# Patient Record
Sex: Male | Born: 1945 | Race: White | Hispanic: No
Health system: Southern US, Community
[De-identification: ages and names within clinical notes are randomized; demographics above are authoritative.]

## PROBLEM LIST (undated history)

## (undated) DIAGNOSIS — I82A11 Acute embolism and thrombosis of right axillary vein: Secondary | ICD-10-CM

## (undated) DIAGNOSIS — I5022 Chronic systolic (congestive) heart failure: Secondary | ICD-10-CM

## (undated) DIAGNOSIS — Z930 Tracheostomy status: Secondary | ICD-10-CM

## (undated) DIAGNOSIS — J9621 Acute and chronic respiratory failure with hypoxia: Secondary | ICD-10-CM

## (undated) HISTORY — DX: Acute embolism and thrombosis of right axillary vein: I82.A11

## (undated) HISTORY — DX: Tracheostomy status: Z93.0

## (undated) HISTORY — DX: Chronic systolic (congestive) heart failure: I50.22

## (undated) HISTORY — DX: Acute and chronic respiratory failure with hypoxia: J96.21

---

## 2020-11-19 ENCOUNTER — Other Ambulatory Visit (HOSPITAL_COMMUNITY): Payer: Medicare Other

## 2020-11-19 ENCOUNTER — Inpatient Hospital Stay
Admission: RE | Admit: 2020-11-19 | Discharge: 2020-12-22 | Disposition: E | Payer: Medicare Other | Attending: Internal Medicine | Admitting: Internal Medicine

## 2020-11-19 DIAGNOSIS — I5022 Chronic systolic (congestive) heart failure: Secondary | ICD-10-CM | POA: Diagnosis present

## 2020-11-19 DIAGNOSIS — Z931 Gastrostomy status: Secondary | ICD-10-CM

## 2020-11-19 DIAGNOSIS — J189 Pneumonia, unspecified organism: Secondary | ICD-10-CM

## 2020-11-19 DIAGNOSIS — J9621 Acute and chronic respiratory failure with hypoxia: Secondary | ICD-10-CM | POA: Diagnosis present

## 2020-11-19 DIAGNOSIS — I82A11 Acute embolism and thrombosis of right axillary vein: Secondary | ICD-10-CM | POA: Diagnosis present

## 2020-11-19 DIAGNOSIS — Z930 Tracheostomy status: Secondary | ICD-10-CM

## 2020-11-19 DIAGNOSIS — I509 Heart failure, unspecified: Secondary | ICD-10-CM

## 2020-11-19 DIAGNOSIS — J9690 Respiratory failure, unspecified, unspecified whether with hypoxia or hypercapnia: Secondary | ICD-10-CM

## 2020-11-19 LAB — URINALYSIS, ROUTINE W REFLEX MICROSCOPIC
Bilirubin Urine: NEGATIVE
Glucose, UA: NEGATIVE mg/dL
Hgb urine dipstick: NEGATIVE
Ketones, ur: NEGATIVE mg/dL
Nitrite: NEGATIVE
Protein, ur: 30 mg/dL — AB
Specific Gravity, Urine: 1.012 (ref 1.005–1.030)
WBC, UA: >50 WBC/hpf — ABNORMAL HIGH (ref 0–5)
pH: 8 (ref 5.0–8.0)

## 2020-11-19 MED ORDER — IOHEXOL 180 MG/ML  SOLN
50.0000 mL | Freq: Once | INTRAMUSCULAR | Status: AC | PRN
  Administered 2020-11-19: 50 mL

## 2020-11-20 DIAGNOSIS — I5022 Chronic systolic (congestive) heart failure: Secondary | ICD-10-CM

## 2020-11-20 DIAGNOSIS — Z930 Tracheostomy status: Secondary | ICD-10-CM | POA: Diagnosis not present

## 2020-11-20 DIAGNOSIS — I82A11 Acute embolism and thrombosis of right axillary vein: Secondary | ICD-10-CM | POA: Diagnosis not present

## 2020-11-20 DIAGNOSIS — J9621 Acute and chronic respiratory failure with hypoxia: Secondary | ICD-10-CM

## 2020-11-20 LAB — COMPREHENSIVE METABOLIC PANEL
ALT: 25 U/L (ref 0–44)
AST: 35 U/L (ref 15–41)
Albumin: 1.7 g/dL — ABNORMAL LOW (ref 3.5–5.0)
Alkaline Phosphatase: 62 U/L (ref 38–126)
Anion gap: 10 (ref 5–15)
BUN: 26 mg/dL — ABNORMAL HIGH (ref 8–23)
CO2: 29 mmol/L (ref 22–32)
Calcium: 8.4 mg/dL — ABNORMAL LOW (ref 8.9–10.3)
Chloride: 99 mmol/L (ref 98–111)
Creatinine, Ser: 0.67 mg/dL (ref 0.61–1.24)
GFR, Estimated: >60 mL/min (ref >60)
Glucose, Bld: 122 mg/dL — ABNORMAL HIGH (ref 70–99)
Potassium: 4.2 mmol/L (ref 3.5–5.1)
Sodium: 138 mmol/L (ref 135–145)
Total Bilirubin: 0.3 mg/dL (ref 0.3–1.2)
Total Protein: 5.9 g/dL — ABNORMAL LOW (ref 6.5–8.1)

## 2020-11-20 LAB — CBC
HCT: 30 % — ABNORMAL LOW (ref 39.0–52.0)
Hemoglobin: 9.5 g/dL — ABNORMAL LOW (ref 13.0–17.0)
MCH: 32.8 pg (ref 26.0–34.0)
MCHC: 31.7 g/dL (ref 30.0–36.0)
MCV: 103.4 fL — ABNORMAL HIGH (ref 80.0–100.0)
Platelets: 155 10*3/uL (ref 150–400)
RBC: 2.9 MIL/uL — ABNORMAL LOW (ref 4.22–5.81)
RDW: 16 % — ABNORMAL HIGH (ref 11.5–15.5)
WBC: 11.1 10*3/uL — ABNORMAL HIGH (ref 4.0–10.5)
nRBC: 0 % (ref 0.0–0.2)

## 2020-11-20 LAB — TSH: TSH: 9.79 u[IU]/mL — ABNORMAL HIGH (ref 0.350–4.500)

## 2020-11-20 NOTE — Consult Note (Signed)
Pulmonary Critical Care Medicine [facility] [place]  PULMONARY SERVICE  Date of Service: 11/20/2020  PULMONARY CRITICAL CARE CONSULT   Jaime Fowler  NUU:[id number]  DOB: 1946-07-20   DOA: Dec 15, 2020  Referring Physician: Carron Curie, MD  HPI: Jaime Fowler is a 74 y.o. male seen for follow up of Acute on Chronic Respiratory Failure. 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 Patient apparently has been on life vest because of the severe cardiomyopathy. He stopped wearing it for about a month because of discomfort and was evaluated for AICD had declined at this time. Patient went to the emergency department because of increasing shortness of breath and leg swelling. At that time patient was found to be in severe pulmonary edema and was actually intubated placed on mechanical ventilation. Subsequently had difficulty weaning off the ventilator and had to have a tracheostomy done. Patient did get a left and right heart catheterization started on anticoagulation was found to have a DVT in the upper extremity. Patient is on T collar currently on 35% FiO2 with good saturations.  Secretions have been minimal  Review of Systems:  ROS performed and is unremarkable other than noted above.  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 General: Comfortable at this time . Eyes: Grossly normal lids, irises & conjunctiva . ENT: grossly tongue is normal . Neck: no obvious mass . Cardiovascular: S1-S2 normal no gallop or rub . Respiratory: No rhonchi coarse breath sounds . Abdomen: Soft and nontender . Skin: no rash seen on limited exam . Musculoskeletal: not rigid . Psychiatric:unable to assess . Neurologic: no seizure no involuntary movements         Labs on Admission:  Basic Metabolic Panel: Recent Labs  Lab 11/20/20 0512  NA 138  K 4.2  CL 99  CO2 29  GLUCOSE 122*  BUN 26*  CREATININE 0.67  CALCIUM 8.4*    No results for input(s): PHART, PCO2ART, PO2ART, HCO3, O2SAT in the last 168 hours.  Liver Function Tests: Recent Labs  Lab 11/20/20 0512  AST 35  ALT 25  ALKPHOS 62  BILITOT 0.3  PROT 5.9*  ALBUMIN 1.7*   No results for input(s): LIPASE, AMYLASE in the last 168 hours. No results for input(s): AMMONIA in the last 168 hours.  CBC: Recent Labs  Lab 11/20/20 0512  WBC 11.1*  HGB 9.5*  HCT 30.0*  MCV 103.4*  PLT 155    Cardiac Enzymes: No results for input(s): CKTOTAL, CKMB, CKMBINDEX, TROPONINI in the last 168 hours.  BNP (last 3 results) No results for input(s): BNP in the last 8760 hours.  ProBNP (last 3 results) No results for input(s): PROBNP in the last 8760 hours.   Radiological Exams on Admission: DG Chest 1 View  Result Date: 12-15-20 CLINICAL DATA:  Respiratory failure EXAM: CHEST  1 VIEW COMPARISON:  None. FINDINGS: Tracheostomy tube tip is just below the level of the  clavicular heads. No focal airspace consolidation. Cardiomediastinal contours are normal. No pleural effusion or pneumothorax. IMPRESSION: Clear lungs. Electronically Signed   By: Deatra Robinson M.D.   On: 12/13/2020 22:17   DG ABDOMEN PEG TUBE LOCATION  Result Date: Dec 10, 2020 CLINICAL DATA:  Gastrostomy tube placement EXAM: ABDOMEN - 1 VIEW COMPARISON:   None. FINDINGS: Contrast injected through the gastrostomy tube outlines rugal folds of the stomach. There is transit seen into the duodenum and proximal jejunum. IMPRESSION: Gastrostomy tube tip in the stomach. Electronically Signed   By: Deatra Robinson M.D.   On: 12/16/2020 22:23    Assessment/Plan Active Problems:   Acute on chronic respiratory failure with hypoxia (HCC)   Chronic HFrEF (heart failure with reduced ejection fraction) (HCC)   Tracheostomy status (HCC)   Acute deep vein thrombosis (DVT) of axillary vein of right upper extremity (HCC)   1. Acute on chronic respiratory failure hypoxia patient is doing fairly well on 35% FiO2 good saturations are noted titrate oxygen continue with aggressive pulmonary toilet. We should be able to titrate the oxygen down and wean further hopefully begin with the PMV and capping prognosis is guarded because of the low ejection fraction. 2. Chronic heart failure reduced ejection fraction the patient's last known EF is 10 to 15%. Patient is on full conservative therapy overall prognosis quite poor 3. Tracheostomy status patient right now is doing fine with the tracheostomy in place we will continue to try to advance weaning as tolerated. 4. Right upper extremity DVT patient has been on Xarelto 20 mg continue  I have personally seen and evaluated the patient, evaluated laboratory and imaging results, formulated the assessment and plan and placed orders. The Patient requires high complexity decision making with multiple systems involvement.  Case was discussed on Rounds with the Respiratory Therapy Director and the Respiratory staff Time Spent  Yevonne Pax, MD [facility] Pulmonary Critical Care Medicine Sleep Medicine

## 2020-11-21 DIAGNOSIS — I82A11 Acute embolism and thrombosis of right axillary vein: Secondary | ICD-10-CM | POA: Diagnosis not present

## 2020-11-21 DIAGNOSIS — I5022 Chronic systolic (congestive) heart failure: Secondary | ICD-10-CM | POA: Diagnosis not present

## 2020-11-21 DIAGNOSIS — Z930 Tracheostomy status: Secondary | ICD-10-CM | POA: Diagnosis not present

## 2020-11-21 DIAGNOSIS — J9621 Acute and chronic respiratory failure with hypoxia: Secondary | ICD-10-CM | POA: Diagnosis not present

## 2020-11-21 NOTE — Progress Notes (Signed)
Pulmonary Critical Care Medicine [facility] [place]   PULMONARY CRITICAL CARE SERVICE  PROGRESS NOTE  Date of Service: 11/21/2020  Jaime Fowler  [id number]  DOB: Jan 02, 1946   DOA: 11/23/2020  Referring Physician: Carron Curie, MD  HPI: Jaime Fowler is a 74 y.o. male seen for follow up of Acute on Chronic Respiratory Failure.  Patient is on T collar has been on 35% FiO2 good saturations are noted.  Secretions are moderate  Medications: Reviewed on Rounds  Physical Exam:  Vitals: Temperature is 97.0 pulse 81 respiratory rate 20 blood pressure 100/51 saturations 99%  Ventilator Settings on T collar FiO2 35%  . General: Comfortable at this time . Eyes: Grossly normal lids, irises & conjunctiva . ENT: grossly tongue is normal . Neck: no obvious mass . Cardiovascular: S1 S2 normal no gallop . Respiratory: Scattered rhonchi expansion is equal . Abdomen: soft . Skin: no rash seen on limited exam . Musculoskeletal: not rigid . Psychiatric:unable to assess . Neurologic: no seizure no involuntary movements         Lab Data:   Basic Metabolic Panel: Recent Labs  Lab 11/20/20 0512  NA 138  K 4.2  CL 99  CO2 29  GLUCOSE 122*  BUN 26*  CREATININE 0.67  CALCIUM 8.4*    ABG: No results for input(s): PHART, PCO2ART, PO2ART, HCO3, O2SAT in the last 168 hours.  Liver Function Tests: Recent Labs  Lab 11/20/20 0512  AST 35  ALT 25  ALKPHOS 62  BILITOT 0.3  PROT 5.9*  ALBUMIN 1.7*   No results for input(s): LIPASE, AMYLASE in the last 168 hours. No results for input(s): AMMONIA in the last 168 hours.  CBC: Recent Labs  Lab 11/20/20 0512  WBC 11.1*  HGB 9.5*  HCT 30.0*  MCV 103.4*  PLT 155    Cardiac Enzymes: No results for input(s): CKTOTAL, CKMB, CKMBINDEX, TROPONINI in the last 168 hours.  BNP (last 3 results) No results for input(s): BNP in the last 8760 hours.  ProBNP (last 3 results) No results for input(s): PROBNP in the last 8760  hours.  Radiological Exams: DG Chest 1 View  Result Date: 11/29/2020 CLINICAL DATA:  Respiratory failure EXAM: CHEST  1 VIEW COMPARISON:  None. FINDINGS: Tracheostomy tube tip is just below the level of the clavicular heads. No focal airspace consolidation. Cardiomediastinal contours are normal. No pleural effusion or pneumothorax. IMPRESSION: Clear lungs. Electronically Signed   By: Deatra Robinson M.D.   On: 10-Dec-2020 22:17   DG ABDOMEN PEG TUBE LOCATION  Result Date: Dec 10, 2020 CLINICAL DATA:  Gastrostomy tube placement EXAM: ABDOMEN - 1 VIEW COMPARISON:  None. FINDINGS: Contrast injected through the gastrostomy tube outlines rugal folds of the stomach. There is transit seen into the duodenum and proximal jejunum. IMPRESSION: Gastrostomy tube tip in the stomach. Electronically Signed   By: Deatra Robinson M.D.   On: 10-Dec-2020 22:23    Assessment/Plan Active Problems:   Acute on chronic respiratory failure with hypoxia (HCC)   Chronic HFrEF (heart failure with reduced ejection fraction) (HCC)   Tracheostomy status (HCC)   Acute deep vein thrombosis (DVT) of axillary vein of right upper extremity (HCC)   1. Acute on chronic respiratory failure hypoxia patient currently resolved T collar will titrate as ordered continue secretion management supportive care. 2. Chronic heart failure reduced ejection fraction poor EF noted on last echo continue with conservative management Entresto anticoagulation beta-blockers 3. Tracheostomy status remains in place we will continue to monitor  closely. 4. Acute DVT treated with anticoagulation has been on Xarelto   I have personally seen and evaluated the patient, evaluated laboratory and imaging results, formulated the assessment and plan and placed orders. The Patient requires high complexity decision making with multiple systems involvement.  Rounds were done with the Respiratory Therapy Director and Staff therapists and discussed with nursing staff  also.  Yevonne Pax, MD [facility] Pulmonary Critical Care Medicine Sleep Medicine

## 2020-11-21 DEATH — deceased

## 2020-11-22 DIAGNOSIS — J9621 Acute and chronic respiratory failure with hypoxia: Secondary | ICD-10-CM | POA: Diagnosis not present

## 2020-11-22 DIAGNOSIS — I82A11 Acute embolism and thrombosis of right axillary vein: Secondary | ICD-10-CM | POA: Diagnosis not present

## 2020-11-22 DIAGNOSIS — Z930 Tracheostomy status: Secondary | ICD-10-CM | POA: Diagnosis not present

## 2020-11-22 DIAGNOSIS — I5022 Chronic systolic (congestive) heart failure: Secondary | ICD-10-CM | POA: Diagnosis not present

## 2020-11-22 NOTE — Progress Notes (Signed)
Pulmonary Critical Care Medicine [facility] [place]   PULMONARY CRITICAL CARE SERVICE  PROGRESS NOTE  Date of Service: 11/22/2020  Jaime Fowler  TML:[id number]  DOB: [birth year]   DOA: 12-11-20  Referring Physician: Carron Curie, MD  HPI: Jaime Fowler is a 74 y.o. male seen for follow up of Acute on Chronic Respiratory Failure.  Patient currently is on 35% FiO2 on T-piece good saturations are noted  Medications: Reviewed on Rounds  Physical Exam:  Vitals: Temperature is 97.4 pulse 72 respiratory 35 blood pressure is 121/77 saturations 98%  Ventilator Settings off ventilator on T collar  . General: Comfortable at this time . Eyes: Grossly normal lids, irises & conjunctiva . ENT: grossly tongue is normal . Neck: no obvious mass . Cardiovascular: S1 S2 normal no gallop . Respiratory: Scattered rhonchi expansion is equal . Abdomen: soft . Skin: no rash seen on limited exam . Musculoskeletal: not rigid . Psychiatric:unable to assess . Neurologic: no seizure no involuntary movements         Lab Data:   Basic Metabolic Panel: Recent Labs  Lab 11/20/20 0512  NA 138  K 4.2  CL 99  CO2 29  GLUCOSE 122*  BUN 26*  CREATININE 0.67  CALCIUM 8.4*    ABG: No results for input(s): PHART, PCO2ART, PO2ART, HCO3, O2SAT in the last 168 hours.  Liver Function Tests: Recent Labs  Lab 11/20/20 0512  AST 35  ALT 25  ALKPHOS 62  BILITOT 0.3  PROT 5.9*  ALBUMIN 1.7*   No results for input(s): LIPASE, AMYLASE in the last 168 hours. No results for input(s): AMMONIA in the last 168 hours.  CBC: Recent Labs  Lab 11/20/20 0512  WBC 11.1*  HGB 9.5*  HCT 30.0*  MCV 103.4*  PLT 155    Cardiac Enzymes: No results for input(s): CKTOTAL, CKMB, CKMBINDEX, TROPONINI in the last 168 hours.  BNP (last 3 results) No results for input(s): BNP in the last 8760 hours.  ProBNP (last 3 results) No results for input(s): PROBNP in the last 8760  hours.  Radiological Exams: No results found.  Assessment/Plan Active Problems:   Acute on chronic respiratory failure with hypoxia (HCC)   Chronic HFrEF (heart failure with reduced ejection fraction) (HCC)   Tracheostomy status (HCC)   Acute deep vein thrombosis (DVT) of axillary vein of right upper extremity (HCC)   1. Acute on chronic respiratory failure with hypoxia we will continue T-piece titrate oxygen as tolerated remains on 35% FiO2. 2. Tracheostomy status tracheostomy remains in place 3. Chronic heart failure reduced ejection fraction at baseline we will continue to monitor 4. Acute DVT treated we will continue with supportive care   I have personally seen and evaluated the patient, evaluated laboratory and imaging results, formulated the assessment and plan and placed orders. The Patient requires high complexity decision making with multiple systems involvement.  Rounds were done with the Respiratory Therapy Director and Staff therapists and discussed with nursing staff also.  Yevonne Pax, MD [facility] Pulmonary Critical Care Medicine Sleep Medicine

## 2020-11-23 ENCOUNTER — Encounter: Payer: Self-pay | Admitting: Internal Medicine

## 2020-11-23 DIAGNOSIS — J9621 Acute and chronic respiratory failure with hypoxia: Secondary | ICD-10-CM | POA: Diagnosis not present

## 2020-11-23 DIAGNOSIS — Z930 Tracheostomy status: Secondary | ICD-10-CM | POA: Diagnosis not present

## 2020-11-23 DIAGNOSIS — I5022 Chronic systolic (congestive) heart failure: Secondary | ICD-10-CM | POA: Diagnosis not present

## 2020-11-23 DIAGNOSIS — I82A11 Acute embolism and thrombosis of right axillary vein: Secondary | ICD-10-CM | POA: Diagnosis present

## 2020-11-23 NOTE — Progress Notes (Signed)
Pulmonary Critical Care Medicine [facility] [place]   PULMONARY CRITICAL CARE SERVICE  PROGRESS NOTE  Date of Service: 11/23/2020  Jaime Fowler  [id number]  DOB: Dec 23, 1945   DOA: 11/14/2020  Referring Physician: Carron Curie, MD  HPI: Jaime Fowler is a 74 y.o. male seen for follow up of Acute on Chronic Respiratory Failure. Patient currently is on T collar has been on 20% FiO2 with good saturations.  Medications: Reviewed on Rounds  Physical Exam:  Vitals: Temperature is 96.9 pulse 73 respiratory 27 blood pressure 105/58 saturations 98%  Ventilator Settings on T collar with an FiO2 of 28%  . General: Comfortable at this time . Eyes: Grossly normal lids, irises & conjunctiva . ENT: grossly tongue is normal . Neck: no obvious mass . Cardiovascular: S1 S2 normal no gallop . Respiratory: No rhonchi no rales noted at this time . Abdomen: soft . Skin: no rash seen on limited exam . Musculoskeletal: not rigid . Psychiatric:unable to assess . Neurologic: no seizure no involuntary movements         Lab Data:   Basic Metabolic Panel: Recent Labs  Lab 11/20/20 0512  NA 138  K 4.2  CL 99  CO2 29  GLUCOSE 122*  BUN 26*  CREATININE 0.67  CALCIUM 8.4*    ABG: No results for input(s): PHART, PCO2ART, PO2ART, HCO3, O2SAT in the last 168 hours.  Liver Function Tests: Recent Labs  Lab 11/20/20 0512  AST 35  ALT 25  ALKPHOS 62  BILITOT 0.3  PROT 5.9*  ALBUMIN 1.7*   No results for input(s): LIPASE, AMYLASE in the last 168 hours. No results for input(s): AMMONIA in the last 168 hours.  CBC: Recent Labs  Lab 11/20/20 0512  WBC 11.1*  HGB 9.5*  HCT 30.0*  MCV 103.4*  PLT 155    Cardiac Enzymes: No results for input(s): CKTOTAL, CKMB, CKMBINDEX, TROPONINI in the last 168 hours.  BNP (last 3 results) No results for input(s): BNP in the last 8760 hours.  ProBNP (last 3 results) No results for input(s): PROBNP in the last 8760  hours.  Radiological Exams: No results found.  Assessment/Plan Active Problems:   Acute on chronic respiratory failure with hypoxia (HCC)   Chronic HFrEF (heart failure with reduced ejection fraction) (HCC)   Tracheostomy status (HCC)   Acute deep vein thrombosis (DVT) of axillary vein of right upper extremity (HCC)   1. Acute on chronic respiratory failure hypoxia we will continue with T-piece oxygen is down to 28%. Continue to titrate. 2. Tracheostomy still remains in place try PMV 3. Chronic heart failure reduced ejection fraction continue supportive care prognosis poor 4. Acute DVT has been on anticoagulation   I have personally seen and evaluated the patient, evaluated laboratory and imaging results, formulated the assessment and plan and placed orders. The Patient requires high complexity decision making with multiple systems involvement.  Rounds were done with the Respiratory Therapy Director and Staff therapists and discussed with nursing staff also.  Yevonne Pax, MD [facility] Pulmonary Critical Care Medicine Sleep Medicine

## 2020-11-24 ENCOUNTER — Other Ambulatory Visit (HOSPITAL_COMMUNITY): Payer: Medicare Other

## 2020-11-24 DIAGNOSIS — I82A11 Acute embolism and thrombosis of right axillary vein: Secondary | ICD-10-CM | POA: Diagnosis not present

## 2020-11-24 DIAGNOSIS — J9621 Acute and chronic respiratory failure with hypoxia: Secondary | ICD-10-CM | POA: Diagnosis not present

## 2020-11-24 DIAGNOSIS — I5022 Chronic systolic (congestive) heart failure: Secondary | ICD-10-CM | POA: Diagnosis not present

## 2020-11-24 DIAGNOSIS — Z930 Tracheostomy status: Secondary | ICD-10-CM | POA: Diagnosis not present

## 2020-11-24 LAB — CBC
HCT: 27.5 % — ABNORMAL LOW (ref 39.0–52.0)
Hemoglobin: 8.8 g/dL — ABNORMAL LOW (ref 13.0–17.0)
MCH: 33.2 pg (ref 26.0–34.0)
MCHC: 32 g/dL (ref 30.0–36.0)
MCV: 103.8 fL — ABNORMAL HIGH (ref 80.0–100.0)
Platelets: 188 10*3/uL (ref 150–400)
RBC: 2.65 MIL/uL — ABNORMAL LOW (ref 4.22–5.81)
RDW: 16.1 % — ABNORMAL HIGH (ref 11.5–15.5)
WBC: 12.2 10*3/uL — ABNORMAL HIGH (ref 4.0–10.5)
nRBC: 0 % (ref 0.0–0.2)

## 2020-11-24 LAB — BASIC METABOLIC PANEL
Anion gap: 10 (ref 5–15)
BUN: 49 mg/dL — ABNORMAL HIGH (ref 8–23)
CO2: 30 mmol/L (ref 22–32)
Calcium: 8.8 mg/dL — ABNORMAL LOW (ref 8.9–10.3)
Chloride: 102 mmol/L (ref 98–111)
Creatinine, Ser: 0.6 mg/dL — ABNORMAL LOW (ref 0.61–1.24)
GFR, Estimated: >60 mL/min (ref >60)
Glucose, Bld: 100 mg/dL — ABNORMAL HIGH (ref 70–99)
Potassium: 4 mmol/L (ref 3.5–5.1)
Sodium: 142 mmol/L (ref 135–145)

## 2020-11-24 NOTE — Progress Notes (Addendum)
Pulmonary Critical Care Medicine [facility] [place]   PULMONARY CRITICAL CARE SERVICE  PROGRESS NOTE  Date of Service: 11/24/2020  [name]  ZOX:[id number]  DOB: 1946-07-03   DOA: 11/18/2020  Referring Physician: Carron Curie, MD  HPI: Jaime Fowler is a 74 y.o. male seen for follow up of Acute on Chronic Respiratory Failure.  Patient currently is on T collar has been on 20% FiO2 with good saturations.  Medications: Reviewed on Rounds  Physical Exam:  Vitals: Temperature is 97.6 pulse 71 respiratory rate is 35 blood pressure is 96/51 saturations 99%  Ventilator Settings on T collar FiO2 28%  . General: Comfortable at this time . Eyes: Grossly normal lids, irises & conjunctiva . ENT: grossly tongue is normal . Neck: no obvious mass . Cardiovascular: S1 S2 normal no gallop . Respiratory: No rhonchi very coarse breath sounds . Abdomen: soft . Skin: no rash seen on limited exam . Musculoskeletal: not rigid . Psychiatric:unable to assess . Neurologic: no seizure no involuntary movements         Lab Data:   Basic Metabolic Panel: Recent Labs  Lab 11/20/20 0512 11/24/20 0359  NA 138 142  K 4.2 4.0  CL 99 102  CO2 29 30  GLUCOSE 122* 100*  BUN 26* 49*  CREATININE 0.67 0.60*  CALCIUM 8.4* 8.8*    ABG: No results for input(s): PHART, PCO2ART, PO2ART, HCO3, O2SAT in the last 168 hours.  Liver Function Tests: Recent Labs  Lab 11/20/20 0512  AST 35  ALT 25  ALKPHOS 62  BILITOT 0.3  PROT 5.9*  ALBUMIN 1.7*   No results for input(s): LIPASE, AMYLASE in the last 168 hours. No results for input(s): AMMONIA in the last 168 hours.  CBC: Recent Labs  Lab 11/20/20 0512 11/24/20 0359  WBC 11.1* 12.2*  HGB 9.5* 8.8*  HCT 30.0* 27.5*  MCV 103.4* 103.8*  PLT 155 188    Cardiac Enzymes: No results for input(s): CKTOTAL, CKMB, CKMBINDEX, TROPONINI in the last 168 hours.  BNP (last 3 results) No results for input(s): BNP in the last 8760  hours.  ProBNP (last 3 results) No results for input(s): PROBNP in the last 8760 hours.  Radiological Exams: DG CHEST PORT 1 VIEW  Result Date: 11/24/2020 CLINICAL DATA:  Respiratory failure EXAM: PORTABLE CHEST 1 VIEW COMPARISON:  Chest x-ray dated 11/18/2020 FINDINGS: Tracheostomy tube appears appropriately positioned in the midline. Heart size and mediastinal contours are stable. Subtle perihilar and bibasilar airspace opacities concerning for early developing pneumonia. No pleural effusion or pneumothorax is seen. IMPRESSION: Subtle bilateral perihilar and basilar airspace opacities, pneumonia versus mild pulmonary edema. Electronically Signed   By: Bary Richard M.D.   On: 11/24/2020 08:16    Assessment/Plan Active Problems:   Acute on chronic respiratory failure with hypoxia (HCC)   Chronic HFrEF (heart failure with reduced ejection fraction) (HCC)   Tracheostomy status (HCC)   Acute deep vein thrombosis (DVT) of axillary vein of right upper extremity (HCC)   1. Acute on chronic respiratory failure hypoxia we will continue T collar trials titrate oxygen continue pulmonary toilet 2. Chronic heart failure reduced ejection appears to be compensated continue to follow 3. Tracheostomy remains in place we will continue to follow along 4. Acute DVT treated   I have personally seen and evaluated the patient, evaluated laboratory and imaging results, formulated the assessment and plan and placed orders. The Patient requires high complexity decision making with multiple systems involvement.  Rounds were  done with the Respiratory Therapy Director and Staff therapists and discussed with nursing staff also.  Allyne Gee, MD [facility] Pulmonary Critical Care Medicine Sleep Medicine

## 2020-11-25 DIAGNOSIS — Z930 Tracheostomy status: Secondary | ICD-10-CM | POA: Diagnosis not present

## 2020-11-25 DIAGNOSIS — I5022 Chronic systolic (congestive) heart failure: Secondary | ICD-10-CM | POA: Diagnosis not present

## 2020-11-25 DIAGNOSIS — J9621 Acute and chronic respiratory failure with hypoxia: Secondary | ICD-10-CM | POA: Diagnosis not present

## 2020-11-25 DIAGNOSIS — I82A11 Acute embolism and thrombosis of right axillary vein: Secondary | ICD-10-CM | POA: Diagnosis not present

## 2020-11-25 NOTE — Progress Notes (Signed)
Pulmonary Critical Care Medicine [facility] [place]   PULMONARY CRITICAL CARE SERVICE  PROGRESS NOTE  Date of Service: 11/25/2020  Jaime Fowler  [id number]  DOB: Jun 09, 1976   DOA: 11/24/2020  Referring Physician: Carron Curie, MD  HPI: Jaime Fowler is a 74 y.o. male seen for follow up of Acute on Chronic Respiratory Failure.  Patient is currently on T collar has been on 20% FiO2 he has copious secretions noted  Medications: Reviewed on Rounds  Physical Exam:  Vitals: Temperature is 97.2 pulse 78 respiratory rate 29 blood pressure is 153/65 saturations 96%  Ventilator Settings on T collar with an FiO2 28%  . General: Comfortable at this time . Eyes: Grossly normal lids, irises & conjunctiva . ENT: grossly tongue is normal . Neck: no obvious mass . Cardiovascular: S1 S2 normal no gallop . Respiratory: Scattered rhonchi very coarse breath sound . Abdomen: soft . Skin: no rash seen on limited exam . Musculoskeletal: not rigid . Psychiatric:unable to assess . Neurologic: no seizure no involuntary movements         Lab Data:   Basic Metabolic Panel: Recent Labs  Lab 11/20/20 0512 11/24/20 0359  NA 138 142  K 4.2 4.0  CL 99 102  CO2 29 30  GLUCOSE 122* 100*  BUN 26* 49*  CREATININE 0.67 0.60*  CALCIUM 8.4* 8.8*    ABG: No results for input(s): PHART, PCO2ART, PO2ART, HCO3, O2SAT in the last 168 hours.  Liver Function Tests: Recent Labs  Lab 11/20/20 0512  AST 35  ALT 25  ALKPHOS 62  BILITOT 0.3  PROT 5.9*  ALBUMIN 1.7*   No results for input(s): LIPASE, AMYLASE in the last 168 hours. No results for input(s): AMMONIA in the last 168 hours.  CBC: Recent Labs  Lab 11/20/20 0512 11/24/20 0359  WBC 11.1* 12.2*  HGB 9.5* 8.8*  HCT 30.0* 27.5*  MCV 103.4* 103.8*  PLT 155 188    Cardiac Enzymes: No results for input(s): CKTOTAL, CKMB, CKMBINDEX, TROPONINI in the last 168 hours.  BNP (last 3 results) No results for input(s): BNP  in the last 8760 hours.  ProBNP (last 3 results) No results for input(s): PROBNP in the last 8760 hours.  Radiological Exams: DG CHEST PORT 1 VIEW  Result Date: 11/24/2020 CLINICAL DATA:  Respiratory failure EXAM: PORTABLE CHEST 1 VIEW COMPARISON:  Chest x-ray dated 2020-11-24 FINDINGS: Tracheostomy tube appears appropriately positioned in the midline. Heart size and mediastinal contours are stable. Subtle perihilar and bibasilar airspace opacities concerning for early developing pneumonia. No pleural effusion or pneumothorax is seen. IMPRESSION: Subtle bilateral perihilar and basilar airspace opacities, pneumonia versus mild pulmonary edema. Electronically Signed   By: Bary Richard M.D.   On: 11/24/2020 08:16    Assessment/Plan Active Problems:   Acute on chronic respiratory failure with hypoxia (HCC)   Chronic HFrEF (heart failure with reduced ejection fraction) (HCC)   Tracheostomy status (HCC)   Acute deep vein thrombosis (DVT) of axillary vein of right upper extremity (HCC)   1. Acute on chronic respiratory failure hypoxia we will continue T collar trials were now on 28% FiO2 secretions are still significant 2. Chronic heart failure reduced ejection fraction supportive care 3. Tracheostomy remains in place 4. DVT treated improving   I have personally seen and evaluated the patient, evaluated laboratory and imaging results, formulated the assessment and plan and placed orders. The Patient requires high complexity decision making with multiple systems involvement.  Rounds were done with the  Respiratory Therapy Director and Staff therapists and discussed with nursing staff also.  Allyne Gee, MD [facility] Pulmonary Critical Care Medicine Sleep Medicine

## 2020-11-26 DIAGNOSIS — I82A11 Acute embolism and thrombosis of right axillary vein: Secondary | ICD-10-CM | POA: Diagnosis not present

## 2020-11-26 DIAGNOSIS — Z930 Tracheostomy status: Secondary | ICD-10-CM | POA: Diagnosis not present

## 2020-11-26 DIAGNOSIS — I5022 Chronic systolic (congestive) heart failure: Secondary | ICD-10-CM | POA: Diagnosis not present

## 2020-11-26 DIAGNOSIS — J9621 Acute and chronic respiratory failure with hypoxia: Secondary | ICD-10-CM | POA: Diagnosis not present

## 2020-11-26 NOTE — Progress Notes (Signed)
Pulmonary Critical Care Medicine [facility] [place]   PULMONARY CRITICAL CARE SERVICE  PROGRESS NOTE  Date of Service: 11/26/2020  Jaime Fowler  DQQ:[id number]  DOB: 20-Dec-1946   DOA: 10/22/2020  Referring Physician: Carron Curie, MD  HPI: Jaime Fowler is a 73 y.o. male seen for follow up of Acute on Chronic Respiratory Failure.  Patient currently is on T collar has been on 28% FiO2 good saturations are noted copious secretions however  Medications: Reviewed on Rounds  Physical Exam:  Vitals: Temperature is 96.8 pulse 103 respiratory 21 blood pressure is 125/68 saturations 98%  Ventilator Settings on T-piece 28% FiO2  . General: Comfortable at this time . Eyes: Grossly normal lids, irises & conjunctiva . ENT: grossly tongue is normal . Neck: no obvious mass . Cardiovascular: S1 S2 normal no gallop . Respiratory: No rhonchi no rales . Abdomen: soft . Skin: no rash seen on limited exam . Musculoskeletal: not rigid . Psychiatric:unable to assess . Neurologic: no seizure no involuntary movements         Lab Data:   Basic Metabolic Panel: Recent Labs  Lab 11/20/20 0512 11/24/20 0359  NA 138 142  K 4.2 4.0  CL 99 102  CO2 29 30  GLUCOSE 122* 100*  BUN 26* 49*  CREATININE 0.67 0.60*  CALCIUM 8.4* 8.8*    ABG: No results for input(s): PHART, PCO2ART, PO2ART, HCO3, O2SAT in the last 168 hours.  Liver Function Tests: Recent Labs  Lab 11/20/20 0512  AST 35  ALT 25  ALKPHOS 62  BILITOT 0.3  PROT 5.9*  ALBUMIN 1.7*   No results for input(s): LIPASE, AMYLASE in the last 168 hours. No results for input(s): AMMONIA in the last 168 hours.  CBC: Recent Labs  Lab 11/20/20 0512 11/24/20 0359  WBC 11.1* 12.2*  HGB 9.5* 8.8*  HCT 30.0* 27.5*  MCV 103.4* 103.8*  PLT 155 188    Cardiac Enzymes: No results for input(s): CKTOTAL, CKMB, CKMBINDEX, TROPONINI in the last 168 hours.  BNP (last 3 results) No results for input(s): BNP in the last  8760 hours.  ProBNP (last 3 results) No results for input(s): PROBNP in the last 8760 hours.  Radiological Exams: No results found.  Assessment/Plan Active Problems:   Acute on chronic respiratory failure with hypoxia (HCC)   Chronic HFrEF (heart failure with reduced ejection fraction) (HCC)   Tracheostomy status (HCC)   Acute deep vein thrombosis (DVT) of axillary vein of right upper extremity (HCC)   1. Acute on chronic respiratory failure hypoxia we will continue with T-piece titrate oxygen continue pulmonary toilet. 2. Chronic heart failure reduced ejection fraction monitoring fluid status 3. Tracheostomy remains in place 4. Acute DVT treated continue with supportive care   I have personally seen and evaluated the patient, evaluated laboratory and imaging results, formulated the assessment and plan and placed orders. The Patient requires high complexity decision making with multiple systems involvement.  Rounds were done with the Respiratory Therapy Director and Staff therapists and discussed with nursing staff also.  Yevonne Pax, MD [facility] Pulmonary Critical Care Medicine Sleep Medicine

## 2020-11-28 DIAGNOSIS — I5022 Chronic systolic (congestive) heart failure: Secondary | ICD-10-CM | POA: Diagnosis not present

## 2020-11-28 DIAGNOSIS — J9621 Acute and chronic respiratory failure with hypoxia: Secondary | ICD-10-CM | POA: Diagnosis not present

## 2020-11-28 DIAGNOSIS — Z930 Tracheostomy status: Secondary | ICD-10-CM | POA: Diagnosis not present

## 2020-11-28 DIAGNOSIS — I82A11 Acute embolism and thrombosis of right axillary vein: Secondary | ICD-10-CM | POA: Diagnosis not present

## 2020-11-28 LAB — URINALYSIS, ROUTINE W REFLEX MICROSCOPIC
Bilirubin Urine: NEGATIVE
Glucose, UA: NEGATIVE mg/dL
Hgb urine dipstick: NEGATIVE
Ketones, ur: NEGATIVE mg/dL
Nitrite: NEGATIVE
Protein, ur: 30 mg/dL — AB
Specific Gravity, Urine: 1.016 (ref 1.005–1.030)
WBC, UA: >50 WBC/hpf — ABNORMAL HIGH (ref 0–5)
pH: 8 (ref 5.0–8.0)

## 2020-11-28 NOTE — Progress Notes (Signed)
Pulmonary Critical Care Medicine [facility] [place]   PULMONARY CRITICAL CARE SERVICE  PROGRESS NOTE  Date of Service: 11/28/2020  Jaime Fowler  [id number]:[id number]  DOB: Jul 28, 1946   DOA: 11/01/2020  Referring Physician: Carron Curie, MD  HPI: Jaime Fowler is a 74 y.o. male seen for follow up of Acute on Chronic Respiratory Failure.  Currently is on T collar has been on 28% FiO2 good saturations.  Medications: Reviewed on Rounds  Physical Exam:  Vitals: Temperature is 96.1 pulse 78 respiratory 27 blood pressure is 117/72 saturations 100%  Ventilator Settings off ventilator on T collar currently on 20% FiO2  . General: Comfortable at this time . Eyes: Grossly normal lids, irises & conjunctiva . ENT: grossly tongue is normal . Neck: no obvious mass . Cardiovascular: S1 S2 normal no gallop . Respiratory: No rhonchi coarse breath sounds . Abdomen: soft . Skin: no rash seen on limited exam . Musculoskeletal: not rigid . Psychiatric:unable to assess . Neurologic: no seizure no involuntary movements         Lab Data:   Basic Metabolic Panel: Recent Labs  Lab 11/24/20 0359  NA 142  K 4.0  CL 102  CO2 30  GLUCOSE 100*  BUN 49*  CREATININE 0.60*  CALCIUM 8.8*    ABG: No results for input(s): PHART, PCO2ART, PO2ART, HCO3, O2SAT in the last 168 hours.  Liver Function Tests: No results for input(s): AST, ALT, ALKPHOS, BILITOT, PROT, ALBUMIN in the last 168 hours. No results for input(s): LIPASE, AMYLASE in the last 168 hours. No results for input(s): AMMONIA in the last 168 hours.  CBC: Recent Labs  Lab 11/24/20 0359  WBC 12.2*  HGB 8.8*  HCT 27.5*  MCV 103.8*  PLT 188    Cardiac Enzymes: No results for input(s): CKTOTAL, CKMB, CKMBINDEX, TROPONINI in the last 168 hours.  BNP (last 3 results) No results for input(s): BNP in the last 8760 hours.  ProBNP (last 3 results) No results for input(s): PROBNP in the last 8760 hours.  Radiological  Exams: No results found.  Assessment/Plan Active Problems:   Acute on chronic respiratory failure with hypoxia (HCC)   Chronic HFrEF (heart failure with reduced ejection fraction) (HCC)   Tracheostomy status (HCC)   Acute deep vein thrombosis (DVT) of axillary vein of right upper extremity (HCC)   1. Acute on chronic respiratory failure with hypoxia we will continue with T-piece titrate oxygen and continue pulmonary toilet. 2. Chronic heart failure reduced ejection fraction supportive care monitor fluid status 3. Tracheostomy remains in place 4. Acute DVT treated we will monitor   I have personally seen and evaluated the patient, evaluated laboratory and imaging results, formulated the assessment and plan and placed orders. The Patient requires high complexity decision making with multiple systems involvement.  Rounds were done with the Respiratory Therapy Director and Staff therapists and discussed with nursing staff also.  Yevonne Pax, MD [facility] Pulmonary Critical Care Medicine Sleep Medicine

## 2020-11-29 ENCOUNTER — Other Ambulatory Visit (HOSPITAL_COMMUNITY): Payer: Medicare Other

## 2020-11-29 DIAGNOSIS — Z930 Tracheostomy status: Secondary | ICD-10-CM | POA: Diagnosis not present

## 2020-11-29 DIAGNOSIS — I5022 Chronic systolic (congestive) heart failure: Secondary | ICD-10-CM | POA: Diagnosis not present

## 2020-11-29 DIAGNOSIS — J9621 Acute and chronic respiratory failure with hypoxia: Secondary | ICD-10-CM | POA: Diagnosis not present

## 2020-11-29 DIAGNOSIS — I82A11 Acute embolism and thrombosis of right axillary vein: Secondary | ICD-10-CM | POA: Diagnosis not present

## 2020-11-29 LAB — CBC
HCT: 30.9 % — ABNORMAL LOW (ref 39.0–52.0)
Hemoglobin: 9.5 g/dL — ABNORMAL LOW (ref 13.0–17.0)
MCH: 32.8 pg (ref 26.0–34.0)
MCHC: 30.7 g/dL (ref 30.0–36.0)
MCV: 106.6 fL — ABNORMAL HIGH (ref 80.0–100.0)
Platelets: 276 10*3/uL (ref 150–400)
RBC: 2.9 MIL/uL — ABNORMAL LOW (ref 4.22–5.81)
RDW: 16.7 % — ABNORMAL HIGH (ref 11.5–15.5)
WBC: 14 10*3/uL — ABNORMAL HIGH (ref 4.0–10.5)
nRBC: 0 % (ref 0.0–0.2)

## 2020-11-29 LAB — BASIC METABOLIC PANEL
Anion gap: 11 (ref 5–15)
BUN: 50 mg/dL — ABNORMAL HIGH (ref 8–23)
CO2: 33 mmol/L — ABNORMAL HIGH (ref 22–32)
Calcium: 8.8 mg/dL — ABNORMAL LOW (ref 8.9–10.3)
Chloride: 104 mmol/L (ref 98–111)
Creatinine, Ser: 0.63 mg/dL (ref 0.61–1.24)
GFR, Estimated: >60 mL/min (ref >60)
Glucose, Bld: 121 mg/dL — ABNORMAL HIGH (ref 70–99)
Potassium: 3.9 mmol/L (ref 3.5–5.1)
Sodium: 148 mmol/L — ABNORMAL HIGH (ref 135–145)

## 2020-11-29 LAB — TSH: TSH: 12.452 u[IU]/mL — ABNORMAL HIGH (ref 0.350–4.500)

## 2020-11-29 LAB — T4, FREE: Free T4: 0.71 ng/dL (ref 0.61–1.12)

## 2020-11-29 NOTE — Progress Notes (Signed)
Pulmonary Critical Care Medicine [facility] [place]   PULMONARY CRITICAL CARE SERVICE  PROGRESS NOTE  Date of Service: 11/29/2020  Jaime Fowler  [id number]:[id number]  DOB: 1946-03-14   DOA: 11/05/2020  Referring Physician: Carron Curie, MD  HPI: Jaime Fowler is a 74 y.o. male seen for follow up of Acute on Chronic Respiratory Failure.  Currently is on T collar has been on 28% FiO2 with good saturations.  Secretions have been fairly moderate  Medications: Reviewed on Rounds  Physical Exam:  Vitals: Temperature is 97.7 pulse 76 respiratory rate is 30 blood pressure 142/72 saturations 99%  Ventilator Settings off the ventilator on T collar FiO2 20%  . General: Comfortable at this time . Eyes: Grossly normal lids, irises & conjunctiva . ENT: grossly tongue is normal . Neck: no obvious mass . Cardiovascular: S1 S2 normal no gallop . Respiratory: No rhonchi no rales are noted at this time . Abdomen: soft . Skin: no rash seen on limited exam . Musculoskeletal: not rigid . Psychiatric:unable to assess . Neurologic: no seizure no involuntary movements         Lab Data:   Basic Metabolic Panel: Recent Labs  Lab 11/24/20 0359 11/29/20 0446  NA 142 148*  K 4.0 3.9  CL 102 104  CO2 30 33*  GLUCOSE 100* 121*  BUN 49* 50*  CREATININE 0.60* 0.63  CALCIUM 8.8* 8.8*    ABG: No results for input(s): PHART, PCO2ART, PO2ART, HCO3, O2SAT in the last 168 hours.  Liver Function Tests: No results for input(s): AST, ALT, ALKPHOS, BILITOT, PROT, ALBUMIN in the last 168 hours. No results for input(s): LIPASE, AMYLASE in the last 168 hours. No results for input(s): AMMONIA in the last 168 hours.  CBC: Recent Labs  Lab 11/24/20 0359 11/29/20 0446  WBC 12.2* 14.0*  HGB 8.8* 9.5*  HCT 27.5* 30.9*  MCV 103.8* 106.6*  PLT 188 276    Cardiac Enzymes: No results for input(s): CKTOTAL, CKMB, CKMBINDEX, TROPONINI in the last 168 hours.  BNP (last 3 results) No results  for input(s): BNP in the last 8760 hours.  ProBNP (last 3 results) No results for input(s): PROBNP in the last 8760 hours.  Radiological Exams: DG Chest Port 1 View  Result Date: 11/29/2020 CLINICAL DATA:  Pneumonia and CHF. EXAM: PORTABLE CHEST 1 VIEW COMPARISON:  11/24/2020 FINDINGS: The tracheostomy tube is stable. Low lung volumes with streaky areas of atelectasis but no definite infiltrates or effusions. No pulmonary edema. The bony thorax is intact. IMPRESSION: Low lung volumes with streaky areas of atelectasis. Electronically Signed   By: Rudie Meyer M.D.   On: 11/29/2020 05:04    Assessment/Plan Active Problems:   Acute on chronic respiratory failure with hypoxia (HCC)   Chronic HFrEF (heart failure with reduced ejection fraction) (HCC)   Tracheostomy status (HCC)   Acute deep vein thrombosis (DVT) of axillary vein of right upper extremity (HCC)   1. Acute on chronic respiratory failure hypoxia we will continue T-piece titrate oxygen continue pulmonary toilet. 2. Chronic heart failure reduced ejection fraction at baseline 3. Tracheostomy remains in place 4. Acute DVT treated with anticoagulation   I have personally seen and evaluated the patient, evaluated laboratory and imaging results, formulated the assessment and plan and placed orders. The Patient requires high complexity decision making with multiple systems involvement.  Rounds were done with the Respiratory Therapy Director and Staff therapists and discussed with nursing staff also.  Yevonne Pax, MD [facility] Pulmonary Critical  Care Medicine Sleep Medicine

## 2020-11-30 DIAGNOSIS — I5022 Chronic systolic (congestive) heart failure: Secondary | ICD-10-CM | POA: Diagnosis not present

## 2020-11-30 DIAGNOSIS — Z930 Tracheostomy status: Secondary | ICD-10-CM | POA: Diagnosis not present

## 2020-11-30 DIAGNOSIS — J9621 Acute and chronic respiratory failure with hypoxia: Secondary | ICD-10-CM | POA: Diagnosis not present

## 2020-11-30 DIAGNOSIS — I82A11 Acute embolism and thrombosis of right axillary vein: Secondary | ICD-10-CM | POA: Diagnosis not present

## 2020-11-30 LAB — CULTURE, RESPIRATORY W GRAM STAIN: Culture: NORMAL

## 2020-11-30 LAB — URINE CULTURE: Culture: >=100000 — AB

## 2020-11-30 NOTE — Progress Notes (Signed)
Pulmonary Critical Care Medicine [facility] [place]   PULMONARY CRITICAL CARE SERVICE  PROGRESS NOTE  Date of Service: 11/30/2020  Jaime Fowler  EZM:[id number]  DOB: Mar 08, 1946   DOA: 10/30/2020  Referring Physician: Carron Curie, MD  HPI: Jaime Fowler is a 74 y.o. male seen for follow up of Acute on Chronic Respiratory Failure.  Patient currently is on T collar has been on 20% FiO2 with good saturations.  Medications: Reviewed on Rounds  Physical Exam:  Vitals: Temperature is 98.2 pulse 77 respiratory 22 blood pressure is 157/63 saturations 99%  Ventilator Settings off the ventilator T collar FiO2 28%  . General: Comfortable at this time . Eyes: Grossly normal lids, irises & conjunctiva . ENT: grossly tongue is normal . Neck: no obvious mass . Cardiovascular: S1 S2 normal no gallop . Respiratory: No rhonchi coarse breath sounds . Abdomen: soft . Skin: no rash seen on limited exam . Musculoskeletal: not rigid . Psychiatric:unable to assess . Neurologic: no seizure no involuntary movements         Lab Data:   Basic Metabolic Panel: Recent Labs  Lab 11/24/20 0359 11/29/20 0446  NA 142 148*  K 4.0 3.9  CL 102 104  CO2 30 33*  GLUCOSE 100* 121*  BUN 49* 50*  CREATININE 0.60* 0.63  CALCIUM 8.8* 8.8*    ABG: No results for input(s): PHART, PCO2ART, PO2ART, HCO3, O2SAT in the last 168 hours.  Liver Function Tests: No results for input(s): AST, ALT, ALKPHOS, BILITOT, PROT, ALBUMIN in the last 168 hours. No results for input(s): LIPASE, AMYLASE in the last 168 hours. No results for input(s): AMMONIA in the last 168 hours.  CBC: Recent Labs  Lab 11/24/20 0359 11/29/20 0446  WBC 12.2* 14.0*  HGB 8.8* 9.5*  HCT 27.5* 30.9*  MCV 103.8* 106.6*  PLT 188 276    Cardiac Enzymes: No results for input(s): CKTOTAL, CKMB, CKMBINDEX, TROPONINI in the last 168 hours.  BNP (last 3 results) No results for input(s): BNP in the last 8760  hours.  ProBNP (last 3 results) No results for input(s): PROBNP in the last 8760 hours.  Radiological Exams: DG Chest Port 1 View  Result Date: 11/29/2020 CLINICAL DATA:  Pneumonia and CHF. EXAM: PORTABLE CHEST 1 VIEW COMPARISON:  11/24/2020 FINDINGS: The tracheostomy tube is stable. Low lung volumes with streaky areas of atelectasis but no definite infiltrates or effusions. No pulmonary edema. The bony thorax is intact. IMPRESSION: Low lung volumes with streaky areas of atelectasis. Electronically Signed   By: Rudie Meyer M.D.   On: 11/29/2020 05:04    Assessment/Plan Active Problems:   Acute on chronic respiratory failure with hypoxia (HCC)   Chronic HFrEF (heart failure with reduced ejection fraction) (HCC)   Tracheostomy status (HCC)   Acute deep vein thrombosis (DVT) of axillary vein of right upper extremity (HCC)   1. Acute on chronic respiratory failure hypoxia we will continue with wean protocol follow-up 2. Chronic heart failure reduced ejection fraction at baseline 3. Tracheostomy remains in place 4. DVT treated slowly improve   I have personally seen and evaluated the patient, evaluated laboratory and imaging results, formulated the assessment and plan and placed orders. The Patient requires high complexity decision making with multiple systems involvement.  Rounds were done with the Respiratory Therapy Director and Staff therapists and discussed with nursing staff also.  Yevonne Pax, MD [facility] Pulmonary Critical Care Medicine Sleep Medicine

## 2020-12-01 DIAGNOSIS — J9621 Acute and chronic respiratory failure with hypoxia: Secondary | ICD-10-CM | POA: Diagnosis not present

## 2020-12-01 DIAGNOSIS — I5022 Chronic systolic (congestive) heart failure: Secondary | ICD-10-CM | POA: Diagnosis not present

## 2020-12-01 DIAGNOSIS — Z930 Tracheostomy status: Secondary | ICD-10-CM | POA: Diagnosis not present

## 2020-12-01 DIAGNOSIS — I82A11 Acute embolism and thrombosis of right axillary vein: Secondary | ICD-10-CM | POA: Diagnosis not present

## 2020-12-01 LAB — CBC
HCT: 30.3 % — ABNORMAL LOW (ref 39.0–52.0)
Hemoglobin: 9.8 g/dL — ABNORMAL LOW (ref 13.0–17.0)
MCH: 33.9 pg (ref 26.0–34.0)
MCHC: 32.3 g/dL (ref 30.0–36.0)
MCV: 104.8 fL — ABNORMAL HIGH (ref 80.0–100.0)
Platelets: 264 10*3/uL (ref 150–400)
RBC: 2.89 MIL/uL — ABNORMAL LOW (ref 4.22–5.81)
RDW: 16.7 % — ABNORMAL HIGH (ref 11.5–15.5)
WBC: 13.7 10*3/uL — ABNORMAL HIGH (ref 4.0–10.5)
nRBC: 0 % (ref 0.0–0.2)

## 2020-12-01 LAB — MAGNESIUM: Magnesium: 2.4 mg/dL (ref 1.7–2.4)

## 2020-12-01 LAB — BASIC METABOLIC PANEL
Anion gap: 8 (ref 5–15)
BUN: 40 mg/dL — ABNORMAL HIGH (ref 8–23)
CO2: 34 mmol/L — ABNORMAL HIGH (ref 22–32)
Calcium: 8.6 mg/dL — ABNORMAL LOW (ref 8.9–10.3)
Chloride: 104 mmol/L (ref 98–111)
Creatinine, Ser: 0.57 mg/dL — ABNORMAL LOW (ref 0.61–1.24)
GFR, Estimated: >60 mL/min (ref >60)
Glucose, Bld: 111 mg/dL — ABNORMAL HIGH (ref 70–99)
Potassium: 4.1 mmol/L (ref 3.5–5.1)
Sodium: 146 mmol/L — ABNORMAL HIGH (ref 135–145)

## 2020-12-01 NOTE — Progress Notes (Signed)
Pulmonary Critical Care Medicine [facility] [place]   PULMONARY CRITICAL CARE SERVICE  PROGRESS NOTE  Date of Service: 12/01/2020  Jaime Fowler  WYO:[id number]  DOB: [birth year]   DOA: 11/13/2020  Referring Physician: Carron Curie, MD  HPI: Jaime Fowler is a 74 y.o. male seen for follow up of Acute on Chronic Respiratory Failure.  Patient is on T collar currently on 28% FiO2 appears to be at baseline secretions are still quite copious  Medications: Reviewed on Rounds  Physical Exam:  Vitals: Temperature is 96.2 pulse 96 respiratory rate 24 blood pressure is 113/67 saturations 95%  Ventilator Settings on T collar with an FiO2 28%  . General: Comfortable at this time . Eyes: Grossly normal lids, irises & conjunctiva . ENT: grossly tongue is normal . Neck: no obvious mass . Cardiovascular: S1 S2 normal no gallop . Respiratory: No rhonchi no rales noted at this time . Abdomen: soft . Skin: no rash seen on limited exam . Musculoskeletal: not rigid . Psychiatric:unable to assess . Neurologic: no seizure no involuntary movements         Lab Data:   Basic Metabolic Panel: Recent Labs  Lab 11/29/20 0446 12/01/20 0602  NA 148* 146*  K 3.9 4.1  CL 104 104  CO2 33* 34*  GLUCOSE 121* 111*  BUN 50* 40*  CREATININE 0.63 0.57*  CALCIUM 8.8* 8.6*  MG  --  2.4    ABG: No results for input(s): PHART, PCO2ART, PO2ART, HCO3, O2SAT in the last 168 hours.  Liver Function Tests: No results for input(s): AST, ALT, ALKPHOS, BILITOT, PROT, ALBUMIN in the last 168 hours. No results for input(s): LIPASE, AMYLASE in the last 168 hours. No results for input(s): AMMONIA in the last 168 hours.  CBC: Recent Labs  Lab 11/29/20 0446 12/01/20 0602  WBC 14.0* 13.7*  HGB 9.5* 9.8*  HCT 30.9* 30.3*  MCV 106.6* 104.8*  PLT 276 264    Cardiac Enzymes: No results for input(s): CKTOTAL, CKMB, CKMBINDEX, TROPONINI in the last 168 hours.  BNP (last 3 results) No results  for input(s): BNP in the last 8760 hours.  ProBNP (last 3 results) No results for input(s): PROBNP in the last 8760 hours.  Radiological Exams: No results found.  Assessment/Plan Active Problems:   Acute on chronic respiratory failure with hypoxia (HCC)   Chronic HFrEF (heart failure with reduced ejection fraction) (HCC)   Tracheostomy status (HCC)   Acute deep vein thrombosis (DVT) of axillary vein of right upper extremity (HCC)   1. Acute on chronic respiratory failure hypoxia we will continue with T collar trials titrate oxygen continue pulmonary toilet. 2. Chronic heart failure reduced ejection fraction at baseline 3. Tracheostomy remains in place 4. Acute DVT treated   I have personally seen and evaluated the patient, evaluated laboratory and imaging results, formulated the assessment and plan and placed orders. The Patient requires high complexity decision making with multiple systems involvement.  Rounds were done with the Respiratory Therapy Director and Staff therapists and discussed with nursing staff also.  Yevonne Pax, MD [facility] Pulmonary Critical Care Medicine Sleep Medicine

## 2020-12-02 DIAGNOSIS — I5022 Chronic systolic (congestive) heart failure: Secondary | ICD-10-CM | POA: Diagnosis not present

## 2020-12-02 DIAGNOSIS — I82A11 Acute embolism and thrombosis of right axillary vein: Secondary | ICD-10-CM | POA: Diagnosis not present

## 2020-12-02 DIAGNOSIS — J9621 Acute and chronic respiratory failure with hypoxia: Secondary | ICD-10-CM | POA: Diagnosis not present

## 2020-12-02 DIAGNOSIS — Z930 Tracheostomy status: Secondary | ICD-10-CM | POA: Diagnosis not present

## 2020-12-02 NOTE — Progress Notes (Signed)
Pulmonary Critical Care Medicine [facility] [place]   PULMONARY CRITICAL CARE SERVICE  PROGRESS NOTE  Date of Service: 12/02/2020  Jaime Fowler  [id number]  DOB: January 10, 1946   DOA: 11/03/2020  Referring Physician: Carron Curie, MD  HPI: Jaime Fowler is a 74 y.o. male seen for follow up of Acute on Chronic Respiratory Failure.  Patient currently is on T collar has been on 20% FiO2 with good saturations.  Medications: Reviewed on Rounds  Physical Exam:  Vitals: Temperature is 96.4 pulse 70 respiratory 24 blood pressure is 105/56 saturations 99%  Ventilator Settings on T collar with an FiO2 28%  . General: Comfortable at this time . Eyes: Grossly normal lids, irises & conjunctiva . ENT: grossly tongue is normal . Neck: no obvious mass . Cardiovascular: S1 S2 normal no gallop . Respiratory: No rhonchi very coarse breath sounds . Abdomen: soft . Skin: no rash seen on limited exam . Musculoskeletal: not rigid . Psychiatric:unable to assess . Neurologic: no seizure no involuntary movements         Lab Data:   Basic Metabolic Panel: Recent Labs  Lab 11/29/20 0446 12/01/20 0602  NA 148* 146*  K 3.9 4.1  CL 104 104  CO2 33* 34*  GLUCOSE 121* 111*  BUN 50* 40*  CREATININE 0.63 0.57*  CALCIUM 8.8* 8.6*  MG  --  2.4    ABG: No results for input(s): PHART, PCO2ART, PO2ART, HCO3, O2SAT in the last 168 hours.  Liver Function Tests: No results for input(s): AST, ALT, ALKPHOS, BILITOT, PROT, ALBUMIN in the last 168 hours. No results for input(s): LIPASE, AMYLASE in the last 168 hours. No results for input(s): AMMONIA in the last 168 hours.  CBC: Recent Labs  Lab 11/29/20 0446 12/01/20 0602  WBC 14.0* 13.7*  HGB 9.5* 9.8*  HCT 30.9* 30.3*  MCV 106.6* 104.8*  PLT 276 264    Cardiac Enzymes: No results for input(s): CKTOTAL, CKMB, CKMBINDEX, TROPONINI in the last 168 hours.  BNP (last 3 results) No results for input(s): BNP in the last 8760  hours.  ProBNP (last 3 results) No results for input(s): PROBNP in the last 8760 hours.  Radiological Exams: No results found.  Assessment/Plan Active Problems:   Acute on chronic respiratory failure with hypoxia (HCC)   Chronic HFrEF (heart failure with reduced ejection fraction) (HCC)   Tracheostomy status (HCC)   Acute deep vein thrombosis (DVT) of axillary vein of right upper extremity (HCC)   1. Acute on chronic respiratory failure hypoxia we will continue with T-piece titrate oxygen continue pulmonary toilet. 2. Chronic heart failure reduced ejection fraction at baseline 3. Tracheostomy remains in place 4. Acute DVT treated we will continue to follow   I have personally seen and evaluated the patient, evaluated laboratory and imaging results, formulated the assessment and plan and placed orders. The Patient requires high complexity decision making with multiple systems involvement.  Rounds were done with the Respiratory Therapy Director and Staff therapists and discussed with nursing staff also.  Yevonne Pax, MD [facility] Pulmonary Critical Care Medicine Sleep Medicine

## 2020-12-03 DIAGNOSIS — Z930 Tracheostomy status: Secondary | ICD-10-CM | POA: Diagnosis not present

## 2020-12-03 DIAGNOSIS — I82A11 Acute embolism and thrombosis of right axillary vein: Secondary | ICD-10-CM | POA: Diagnosis not present

## 2020-12-03 DIAGNOSIS — I5022 Chronic systolic (congestive) heart failure: Secondary | ICD-10-CM | POA: Diagnosis not present

## 2020-12-03 DIAGNOSIS — J9621 Acute and chronic respiratory failure with hypoxia: Secondary | ICD-10-CM | POA: Diagnosis not present

## 2020-12-03 NOTE — Progress Notes (Signed)
Pulmonary Critical Care Medicine [facility] [place]   PULMONARY CRITICAL CARE SERVICE  PROGRESS NOTE  Date of Service: 12/03/2020  Jaime Fowler  [id number]  DOB: 04/11/1946   DOA: 11/16/2020  Referring Physician: Carron Curie, MD  HPI: Jaime Fowler is a 74 y.o. male seen for follow up of Acute on Chronic Respiratory Failure.  Patient is on T-piece currently on 28% FiO2 good saturations are noted  Medications: Reviewed on Rounds  Physical Exam:  Vitals: Temperature 96.9 pulse 69 respiratory rate 20 blood pressure is 94/58 saturations 100%  Ventilator Settings on T-piece currently on 28% FiO2 with good saturations   General: Comfortable at this time  Eyes: Grossly normal lids, irises & conjunctiva  ENT: grossly tongue is normal  Neck: no obvious mass  Cardiovascular: S1 S2 normal no gallop  Respiratory: Scattered rhonchi expansion is equal  Abdomen: soft  Skin: no rash seen on limited exam  Musculoskeletal: not rigid  Psychiatric:unable to assess  Neurologic: no seizure no involuntary movements         Lab Data:   Basic Metabolic Panel: Recent Labs  Lab 11/29/20 0446 12/01/20 0602  NA 148* 146*  K 3.9 4.1  CL 104 104  CO2 33* 34*  GLUCOSE 121* 111*  BUN 50* 40*  CREATININE 0.63 0.57*  CALCIUM 8.8* 8.6*  MG  --  2.4    ABG: No results for input(s): PHART, PCO2ART, PO2ART, HCO3, O2SAT in the last 168 hours.  Liver Function Tests: No results for input(s): AST, ALT, ALKPHOS, BILITOT, PROT, ALBUMIN in the last 168 hours. No results for input(s): LIPASE, AMYLASE in the last 168 hours. No results for input(s): AMMONIA in the last 168 hours.  CBC: Recent Labs  Lab 11/29/20 0446 12/01/20 0602  WBC 14.0* 13.7*  HGB 9.5* 9.8*  HCT 30.9* 30.3*  MCV 106.6* 104.8*  PLT 276 264    Cardiac Enzymes: No results for input(s): CKTOTAL, CKMB, CKMBINDEX, TROPONINI in the last 168 hours.  BNP (last 3 results) No results for input(s):  BNP in the last 8760 hours.  ProBNP (last 3 results) No results for input(s): PROBNP in the last 8760 hours.  Radiological Exams: No results found.  Assessment/Plan Active Problems:   Acute on chronic respiratory failure with hypoxia (HCC)   Chronic HFrEF (heart failure with reduced ejection fraction) (HCC)   Tracheostomy status (HCC)   Acute deep vein thrombosis (DVT) of axillary vein of right upper extremity (HCC)   1. Acute on chronic respiratory failure hypoxia we will continue with T-piece titrate oxygen continue pulmonary toilet. 2. Chronic heart failure reduced ejection fraction follow fluid status diuresis as tolerated 3. Tracheostomy remains in place 4. Acute DVT treated we will continue with supportive care   I have personally seen and evaluated the patient, evaluated laboratory and imaging results, formulated the assessment and plan and placed orders. The Patient requires high complexity decision making with multiple systems involvement.  Rounds were done with the Respiratory Therapy Director and Staff therapists and discussed with nursing staff also.  Yevonne Pax, MD [facility] Pulmonary Critical Care Medicine Sleep Medicine

## 2020-12-04 DIAGNOSIS — I5022 Chronic systolic (congestive) heart failure: Secondary | ICD-10-CM | POA: Diagnosis not present

## 2020-12-04 DIAGNOSIS — J9621 Acute and chronic respiratory failure with hypoxia: Secondary | ICD-10-CM | POA: Diagnosis not present

## 2020-12-04 DIAGNOSIS — Z930 Tracheostomy status: Secondary | ICD-10-CM | POA: Diagnosis not present

## 2020-12-04 DIAGNOSIS — I82A11 Acute embolism and thrombosis of right axillary vein: Secondary | ICD-10-CM | POA: Diagnosis not present

## 2020-12-04 LAB — BASIC METABOLIC PANEL
Anion gap: 10 (ref 5–15)
BUN: 40 mg/dL — ABNORMAL HIGH (ref 8–23)
CO2: 30 mmol/L (ref 22–32)
Calcium: 8.5 mg/dL — ABNORMAL LOW (ref 8.9–10.3)
Chloride: 105 mmol/L (ref 98–111)
Creatinine, Ser: 0.69 mg/dL (ref 0.61–1.24)
GFR, Estimated: >60 mL/min (ref >60)
Glucose, Bld: 125 mg/dL — ABNORMAL HIGH (ref 70–99)
Potassium: 4.2 mmol/L (ref 3.5–5.1)
Sodium: 145 mmol/L (ref 135–145)

## 2020-12-04 NOTE — Progress Notes (Signed)
Pulmonary Critical Care Medicine [facility] [place]   PULMONARY CRITICAL CARE SERVICE  PROGRESS NOTE  Date of Service: 12/04/2020  Jaime Fowler  SPQ:[id number]  DOB: [birth year]   DOA: 10/29/2020  Referring Physician: Carron Curie, MD  HPI: Jaime Fowler is a 74 y.o. male seen for follow up of Acute on Chronic Respiratory Failure.  Patient currently is on T collar has been on 28% FiO2  Medications: Reviewed on Rounds  Physical Exam:  Vitals: Temperature 98.1 pulse of 80 respiratory rate 20 blood pressure is 121/69 saturations 99%  Ventilator Settings on T-piece FiO2 28%  . General: Comfortable at this time . Eyes: Grossly normal lids, irises & conjunctiva . ENT: grossly tongue is normal . Neck: no obvious mass . Cardiovascular: S1 S2 normal no gallop . Respiratory: No rhonchi very coarse breath sounds . Abdomen: soft . Skin: no rash seen on limited exam . Musculoskeletal: not rigid . Psychiatric:unable to assess . Neurologic: no seizure no involuntary movements         Lab Data:   Basic Metabolic Panel: Recent Labs  Lab 11/29/20 0446 12/01/20 0602 12/04/20 0505  NA 148* 146* 145  K 3.9 4.1 4.2  CL 104 104 105  CO2 33* 34* 30  GLUCOSE 121* 111* 125*  BUN 50* 40* 40*  CREATININE 0.63 0.57* 0.69  CALCIUM 8.8* 8.6* 8.5*  MG  --  2.4  --     ABG: No results for input(s): PHART, PCO2ART, PO2ART, HCO3, O2SAT in the last 168 hours.  Liver Function Tests: No results for input(s): AST, ALT, ALKPHOS, BILITOT, PROT, ALBUMIN in the last 168 hours. No results for input(s): LIPASE, AMYLASE in the last 168 hours. No results for input(s): AMMONIA in the last 168 hours.  CBC: Recent Labs  Lab 11/29/20 0446 12/01/20 0602  WBC 14.0* 13.7*  HGB 9.5* 9.8*  HCT 30.9* 30.3*  MCV 106.6* 104.8*  PLT 276 264    Cardiac Enzymes: No results for input(s): CKTOTAL, CKMB, CKMBINDEX, TROPONINI in the last 168 hours.  BNP (last 3 results) No results for  input(s): BNP in the last 8760 hours.  ProBNP (last 3 results) No results for input(s): PROBNP in the last 8760 hours.  Radiological Exams: No results found.  Assessment/Plan Active Problems:   Acute on chronic respiratory failure with hypoxia (HCC)   Chronic HFrEF (heart failure with reduced ejection fraction) (HCC)   Tracheostomy status (HCC)   Acute deep vein thrombosis (DVT) of axillary vein of right upper extremity (HCC)   1. Acute on chronic respiratory failure with hypoxia patient currently is on T-piece will titrate oxygen continue pulmonary toilet. 2. Chronic heart failure reduced ejection fraction at baseline 3. Acute DVT treated 4. Tracheostomy remains in place   I have personally seen and evaluated the patient, evaluated laboratory and imaging results, formulated the assessment and plan and placed orders. The Patient requires high complexity decision making with multiple systems involvement.  Rounds were done with the Respiratory Therapy Director and Staff therapists and discussed with nursing staff also.  Yevonne Pax, MD [facility] Pulmonary Critical Care Medicine Sleep Medicine

## 2020-12-05 DIAGNOSIS — J9621 Acute and chronic respiratory failure with hypoxia: Secondary | ICD-10-CM | POA: Diagnosis not present

## 2020-12-05 DIAGNOSIS — Z930 Tracheostomy status: Secondary | ICD-10-CM | POA: Diagnosis not present

## 2020-12-05 DIAGNOSIS — I5022 Chronic systolic (congestive) heart failure: Secondary | ICD-10-CM | POA: Diagnosis not present

## 2020-12-05 DIAGNOSIS — I82A11 Acute embolism and thrombosis of right axillary vein: Secondary | ICD-10-CM | POA: Diagnosis not present

## 2020-12-06 DIAGNOSIS — J9621 Acute and chronic respiratory failure with hypoxia: Secondary | ICD-10-CM | POA: Diagnosis not present

## 2020-12-06 DIAGNOSIS — I82A11 Acute embolism and thrombosis of right axillary vein: Secondary | ICD-10-CM | POA: Diagnosis not present

## 2020-12-06 DIAGNOSIS — I5022 Chronic systolic (congestive) heart failure: Secondary | ICD-10-CM | POA: Diagnosis not present

## 2020-12-06 DIAGNOSIS — Z930 Tracheostomy status: Secondary | ICD-10-CM | POA: Diagnosis not present

## 2020-12-06 LAB — BASIC METABOLIC PANEL
Anion gap: 11 (ref 5–15)
BUN: 91 mg/dL — ABNORMAL HIGH (ref 8–23)
CO2: 30 mmol/L (ref 22–32)
Calcium: 8.7 mg/dL — ABNORMAL LOW (ref 8.9–10.3)
Chloride: 108 mmol/L (ref 98–111)
Creatinine, Ser: 1.28 mg/dL — ABNORMAL HIGH (ref 0.61–1.24)
GFR, Estimated: 58 mL/min — ABNORMAL LOW (ref >60)
Glucose, Bld: 116 mg/dL — ABNORMAL HIGH (ref 70–99)
Potassium: 4.4 mmol/L (ref 3.5–5.1)
Sodium: 149 mmol/L — ABNORMAL HIGH (ref 135–145)

## 2020-12-06 NOTE — Progress Notes (Signed)
Pulmonary Critical Care Medicine [facility] [place]   PULMONARY CRITICAL CARE SERVICE  PROGRESS NOTE  Date of Service: 12/05/2020  Jaime Fowler  AOZ:[id number]  DOB: [birth year]   DOA: Dec 09, 2020  Referring Physician: Carron Curie, MD  HPI: Jaime Fowler is a 74 y.o. male seen for follow up of Acute on Chronic Respiratory Failure.  Patient currently is on T collar has been on 28% FiO2 secretions do seem to be getting better  Medications: Reviewed on Rounds  Physical Exam:  Vitals: Temperature is 99.7 pulse 96 respiratory 28 blood pressure is 1 12/22/1955 saturations 97%  Ventilator Settings patient is on T collar FiO2 20%   General: Comfortable at this time  Eyes: Grossly normal lids, irises & conjunctiva  ENT: grossly tongue is normal  Neck: no obvious mass  Cardiovascular: S1 S2 normal no gallop  Respiratory: No rhonchi no rales noted  Abdomen: soft  Skin: no rash seen on limited exam  Musculoskeletal: not rigid  Psychiatric:unable to assess  Neurologic: no seizure no involuntary movements         Lab Data:   Basic Metabolic Panel: Recent Labs  Lab 12/01/20 0602 12/04/20 0505 12/06/20 0408  NA 146* 145 149*  K 4.1 4.2 4.4  CL 104 105 108  CO2 34* 30 30  GLUCOSE 111* 125* 116*  BUN 40* 40* 91*  CREATININE 0.57* 0.69 1.28*  CALCIUM 8.6* 8.5* 8.7*  MG 2.4  --   --     ABG: No results for input(s): PHART, PCO2ART, PO2ART, HCO3, O2SAT in the last 168 hours.  Liver Function Tests: No results for input(s): AST, ALT, ALKPHOS, BILITOT, PROT, ALBUMIN in the last 168 hours. No results for input(s): LIPASE, AMYLASE in the last 168 hours. No results for input(s): AMMONIA in the last 168 hours.  CBC: Recent Labs  Lab 12/01/20 0602  WBC 13.7*  HGB 9.8*  HCT 30.3*  MCV 104.8*  PLT 264    Cardiac Enzymes: No results for input(s): CKTOTAL, CKMB, CKMBINDEX, TROPONINI in the last 168 hours.  BNP (last 3 results) No results for input(s):  BNP in the last 8760 hours.  ProBNP (last 3 results) No results for input(s): PROBNP in the last 8760 hours.  Radiological Exams: No results found.  Assessment/Plan Active Problems:   Acute on chronic respiratory failure with hypoxia (HCC)   Chronic HFrEF (heart failure with reduced ejection fraction) (HCC)   Tracheostomy status (HCC)   Acute deep vein thrombosis (DVT) of axillary vein of right upper extremity (HCC)   1. Acute on chronic respiratory failure hypoxia we will continue T collar titrate oxygen continue pulmonary toilet. 2. Chronic heart failure reduced ejection fraction at baseline 3. Tracheostomy remains in place 4. Acute DVT treated   I have personally seen and evaluated the patient, evaluated laboratory and imaging results, formulated the assessment and plan and placed orders. The Patient requires high complexity decision making with multiple systems involvement.  Rounds were done with the Respiratory Therapy Director and Staff therapists and discussed with nursing staff also.  Yevonne Pax, MD [facility] Pulmonary Critical Care Medicine Sleep Medicine

## 2020-12-06 NOTE — Progress Notes (Signed)
Pulmonary Critical Care Medicine [facility] [place]   PULMONARY CRITICAL CARE SERVICE  PROGRESS NOTE  Date of Service: 12/06/2020  Jaime Fowler  IOE:[id number]  DOB: 09/13/1946   DOA: 2020-12-04  Referring Physician: Carron Curie, MD  HPI: Jaime Fowler is a 74 y.o. male seen for follow up of Acute on Chronic Respiratory Failure.  On T collar now on 28% FiO2 good saturations.  Medications: Reviewed on Rounds  Physical Exam:  Vitals: Temperature is 97.2 pulse 82 respiratory 29 blood pressure is 93/47 saturations 97%  Ventilator Settings off ventilator on T collar currently requiring 28%  . General: Comfortable at this time . Eyes: Grossly normal lids, irises & conjunctiva . ENT: grossly tongue is normal . Neck: no obvious mass . Cardiovascular: S1 S2 normal no gallop . Respiratory: No rhonchi very coarse breath sounds . Abdomen: soft . Skin: no rash seen on limited exam . Musculoskeletal: not rigid . Psychiatric:unable to assess . Neurologic: no seizure no involuntary movements         Lab Data:   Basic Metabolic Panel: Recent Labs  Lab 12/01/20 0602 12/04/20 0505 12/06/20 0408  NA 146* 145 149*  K 4.1 4.2 4.4  CL 104 105 108  CO2 34* 30 30  GLUCOSE 111* 125* 116*  BUN 40* 40* 91*  CREATININE 0.57* 0.69 1.28*  CALCIUM 8.6* 8.5* 8.7*  MG 2.4  --   --     ABG: No results for input(s): PHART, PCO2ART, PO2ART, HCO3, O2SAT in the last 168 hours.  Liver Function Tests: No results for input(s): AST, ALT, ALKPHOS, BILITOT, PROT, ALBUMIN in the last 168 hours. No results for input(s): LIPASE, AMYLASE in the last 168 hours. No results for input(s): AMMONIA in the last 168 hours.  CBC: Recent Labs  Lab 12/01/20 0602  WBC 13.7*  HGB 9.8*  HCT 30.3*  MCV 104.8*  PLT 264    Cardiac Enzymes: No results for input(s): CKTOTAL, CKMB, CKMBINDEX, TROPONINI in the last 168 hours.  BNP (last 3 results) No results for input(s): BNP in the last 8760  hours.  ProBNP (last 3 results) No results for input(s): PROBNP in the last 8760 hours.  Radiological Exams: No results found.  Assessment/Plan Active Problems:   Acute on chronic respiratory failure with hypoxia (HCC)   Chronic HFrEF (heart failure with reduced ejection fraction) (HCC)   Tracheostomy status (HCC)   Acute deep vein thrombosis (DVT) of axillary vein of right upper extremity (HCC)   1. Acute on chronic respiratory failure with hypoxia patient right now is on T collar doing fairly well currently on 28% FiO2. 2. Chronic heart failure reduced ejection fraction at baseline diuretics as tolerated. 3. Tracheostomy remains in place 4. DVT treated with anticoagulation no sign of active bleeding   I have personally seen and evaluated the patient, evaluated laboratory and imaging results, formulated the assessment and plan and placed orders. The Patient requires high complexity decision making with multiple systems involvement.  Rounds were done with the Respiratory Therapy Director and Staff therapists and discussed with nursing staff also.  Yevonne Pax, MD [facility] Pulmonary Critical Care Medicine Sleep Medicine

## 2020-12-07 ENCOUNTER — Other Ambulatory Visit (HOSPITAL_COMMUNITY): Payer: Medicare Other

## 2020-12-07 DIAGNOSIS — I5022 Chronic systolic (congestive) heart failure: Secondary | ICD-10-CM | POA: Diagnosis not present

## 2020-12-07 DIAGNOSIS — Z930 Tracheostomy status: Secondary | ICD-10-CM | POA: Diagnosis not present

## 2020-12-07 DIAGNOSIS — I82A11 Acute embolism and thrombosis of right axillary vein: Secondary | ICD-10-CM | POA: Diagnosis not present

## 2020-12-07 DIAGNOSIS — J9621 Acute and chronic respiratory failure with hypoxia: Secondary | ICD-10-CM | POA: Diagnosis not present

## 2020-12-07 LAB — BLOOD GAS, ARTERIAL
Acid-Base Excess: 8.8 mmol/L — ABNORMAL HIGH (ref 0.0–2.0)
Bicarbonate: 32.9 mmol/L — ABNORMAL HIGH (ref 20.0–28.0)
FIO2: 35
O2 Saturation: 97.6 %
Patient temperature: 37
pCO2 arterial: 46.2 mmHg (ref 32.0–48.0)
pH, Arterial: 7.467 — ABNORMAL HIGH (ref 7.350–7.450)
pO2, Arterial: 93.8 mmHg (ref 83.0–108.0)

## 2020-12-07 NOTE — Progress Notes (Signed)
Pulmonary Critical Care Medicine [facility] [place]   PULMONARY CRITICAL CARE SERVICE  PROGRESS NOTE  Date of Service: 12/07/2020  Jaime Fowler  PYP:[id number]  DOB: December 22, 1946   DOA: 11/20/2020  Referring Physician: Carron Curie, MD  HPI: Jaime Fowler is a 74 y.o. male seen for follow up of Acute on Chronic Respiratory Failure.  Patient currently is on 35% FiO2 good saturations are noted.  Secretions are fairly moderate  Medications: Reviewed on Rounds  Physical Exam:  Vitals: Temperature is 97.4 pulse 84 respiratory 20 blood pressure is 96/55 saturations 94%  Ventilator Settings on 35% FiO2  . General: Comfortable at this time . Eyes: Grossly normal lids, irises & conjunctiva . ENT: grossly tongue is normal . Neck: no obvious mass . Cardiovascular: S1 S2 normal no gallop . Respiratory: Scattered coarse breath sounds noted bilaterally . Abdomen: soft . Skin: no rash seen on limited exam . Musculoskeletal: not rigid . Psychiatric:unable to assess . Neurologic: no seizure no involuntary movements         Lab Data:   Basic Metabolic Panel: Recent Labs  Lab 12/01/20 0602 12/04/20 0505 12/06/20 0408  NA 146* 145 149*  K 4.1 4.2 4.4  CL 104 105 108  CO2 34* 30 30  GLUCOSE 111* 125* 116*  BUN 40* 40* 91*  CREATININE 0.57* 0.69 1.28*  CALCIUM 8.6* 8.5* 8.7*  MG 2.4  --   --     ABG: No results for input(s): PHART, PCO2ART, PO2ART, HCO3, O2SAT in the last 168 hours.  Liver Function Tests: No results for input(s): AST, ALT, ALKPHOS, BILITOT, PROT, ALBUMIN in the last 168 hours. No results for input(s): LIPASE, AMYLASE in the last 168 hours. No results for input(s): AMMONIA in the last 168 hours.  CBC: Recent Labs  Lab 12/01/20 0602  WBC 13.7*  HGB 9.8*  HCT 30.3*  MCV 104.8*  PLT 264    Cardiac Enzymes: No results for input(s): CKTOTAL, CKMB, CKMBINDEX, TROPONINI in the last 168 hours.  BNP (last 3 results) No results for input(s):  BNP in the last 8760 hours.  ProBNP (last 3 results) No results for input(s): PROBNP in the last 8760 hours.  Radiological Exams: No results found.  Assessment/Plan Active Problems:   Acute on chronic respiratory failure with hypoxia (HCC)   Chronic HFrEF (heart failure with reduced ejection fraction) (HCC)   Tracheostomy status (HCC)   Acute deep vein thrombosis (DVT) of axillary vein of right upper extremity (HCC)   1. Acute on chronic respiratory failure hypoxia we will continue with the weaning off FiO2 down as tolerated. 2. Chronic heart failure reduced ejection fraction at baseline 3. Tracheostomy remains in place 4. Acute DVT treated slow improvement   I have personally seen and evaluated the patient, evaluated laboratory and imaging results, formulated the assessment and plan and placed orders. The Patient requires high complexity decision making with multiple systems involvement.  Rounds were done with the Respiratory Therapy Director and Staff therapists and discussed with nursing staff also.  Yevonne Pax, MD [facility] Pulmonary Critical Care Medicine Sleep Medicine

## 2020-12-08 DIAGNOSIS — Z930 Tracheostomy status: Secondary | ICD-10-CM | POA: Diagnosis not present

## 2020-12-08 DIAGNOSIS — I82A11 Acute embolism and thrombosis of right axillary vein: Secondary | ICD-10-CM | POA: Diagnosis not present

## 2020-12-08 DIAGNOSIS — J9621 Acute and chronic respiratory failure with hypoxia: Secondary | ICD-10-CM | POA: Diagnosis not present

## 2020-12-08 DIAGNOSIS — I5022 Chronic systolic (congestive) heart failure: Secondary | ICD-10-CM | POA: Diagnosis not present

## 2020-12-08 NOTE — Progress Notes (Signed)
Pulmonary Critical Care Medicine [facility] [place]   PULMONARY CRITICAL CARE SERVICE  PROGRESS NOTE  Date of Service: 12/08/2020  Jaime Fowler  BJS:[id number]  DOB: Nov 23, 1946   DOA: 12-07-20  Referring Physician: Carron Curie, MD  HPI: Jaime Fowler is a 74 y.o. male seen for follow up of Acute on Chronic Respiratory Failure.  Patient currently is on T collar has been on 28% FiO2 good saturations are noted.  Medications: Reviewed on Rounds  Physical Exam:  Vitals: Temperature is 98.0 pulse 79 respiratory rate is 37 blood pressure is 114/57 saturations 95%  Ventilator Settings on T collar with an FiO2 of 28%  . General: Comfortable at this time . Eyes: Grossly normal lids, irises & conjunctiva . ENT: grossly tongue is normal . Neck: no obvious mass . Cardiovascular: S1 S2 normal no gallop . Respiratory: No rhonchi rales . Abdomen: soft . Skin: no rash seen on limited exam . Musculoskeletal: not rigid . Psychiatric:unable to assess . Neurologic: no seizure no involuntary movements         Lab Data:   Basic Metabolic Panel: Recent Labs  Lab 12/04/20 0505 12/06/20 0408  NA 145 149*  K 4.2 4.4  CL 105 108  CO2 30 30  GLUCOSE 125* 116*  BUN 40* 91*  CREATININE 0.69 1.28*  CALCIUM 8.5* 8.7*    ABG: Recent Labs  Lab 12/07/20 1228  PHART 7.467*  PCO2ART 46.2  PO2ART 93.8  HCO3 32.9*  O2SAT 97.6    Liver Function Tests: No results for input(s): AST, ALT, ALKPHOS, BILITOT, PROT, ALBUMIN in the last 168 hours. No results for input(s): LIPASE, AMYLASE in the last 168 hours. No results for input(s): AMMONIA in the last 168 hours.  CBC: No results for input(s): WBC, NEUTROABS, HGB, HCT, MCV, PLT in the last 168 hours.  Cardiac Enzymes: No results for input(s): CKTOTAL, CKMB, CKMBINDEX, TROPONINI in the last 168 hours.  BNP (last 3 results) No results for input(s): BNP in the last 8760 hours.  ProBNP (last 3 results) No results for  input(s): PROBNP in the last 8760 hours.  Radiological Exams: DG CHEST PORT 1 VIEW  Result Date: 12/07/2020 CLINICAL DATA:  Pneumonia EXAM: PORTABLE CHEST 1 VIEW COMPARISON:  11/29/2020 FINDINGS: Cardiac shadow is stable. Tracheostomy tube is again noted and stable. Lungs are well aerated bilaterally. Previously seen left basilar atelectasis has resolved. No new focal abnormality is noted. IMPRESSION: Resolution of previously seen atelectasis. Electronically Signed   By: Alcide Clever M.D.   On: 12/07/2020 11:52    Assessment/Plan Active Problems:   Acute on chronic respiratory failure with hypoxia (HCC)   Chronic HFrEF (heart failure with reduced ejection fraction) (HCC)   Tracheostomy status (HCC)   Acute deep vein thrombosis (DVT) of axillary vein of right upper extremity (HCC)   1. Acute on chronic respiratory failure hypoxia we will continue with 28% FiO2 secretions are limiting factor as far as being able to wean. 2. Chronic heart failure reduced ejection fraction continue supportive care 3. Tracheostomy will remain in place 4. DVT treated   I have personally seen and evaluated the patient, evaluated laboratory and imaging results, formulated the assessment and plan and placed orders. The Patient requires high complexity decision making with multiple systems involvement.  Rounds were done with the Respiratory Therapy Director and Staff therapists and discussed with nursing staff also.  Jaime Pax, MD [facility] Pulmonary Critical Care Medicine Sleep Medicine

## 2020-12-22 DEATH — deceased

## 2022-08-10 IMAGING — DX DG CHEST 1V PORT
1 series · 1 of 1 positions shown · non-contrast
Comparison: 11/24/2020

CLINICAL DATA: Pneumonia and CHF.

EXAM:
PORTABLE CHEST 1 VIEW

[chest ap]
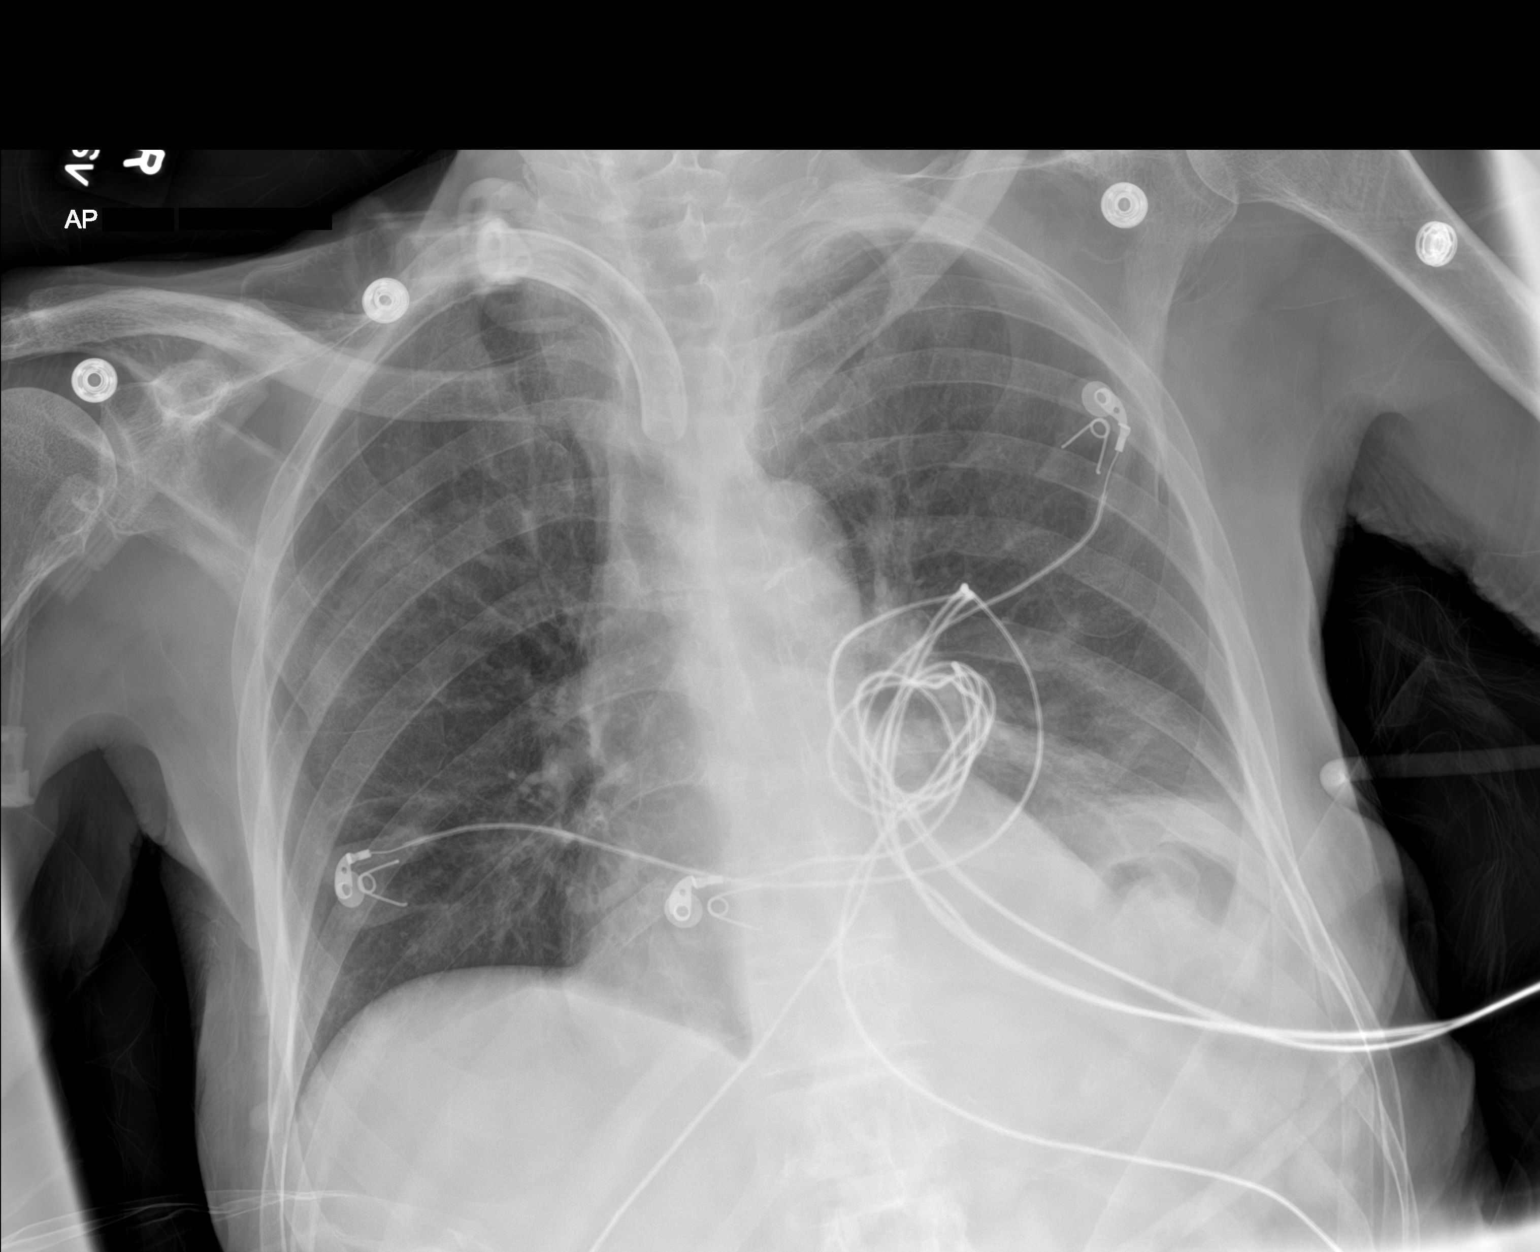

[1 of 1 positions shown; findings below may reference images not displayed]

FINDINGS: The tracheostomy tube is stable.

Low lung volumes with streaky areas of atelectasis but no definite
infiltrates or effusions. No pulmonary edema. The bony thorax is
intact.
IMPRESSION: Low lung volumes with streaky areas of atelectasis.

## 2022-08-18 IMAGING — DX DG CHEST 1V PORT
1 series · 1 of 1 positions shown · non-contrast
Comparison: 11/29/2020

CLINICAL DATA: Pneumonia

EXAM:
PORTABLE CHEST 1 VIEW

[chest]
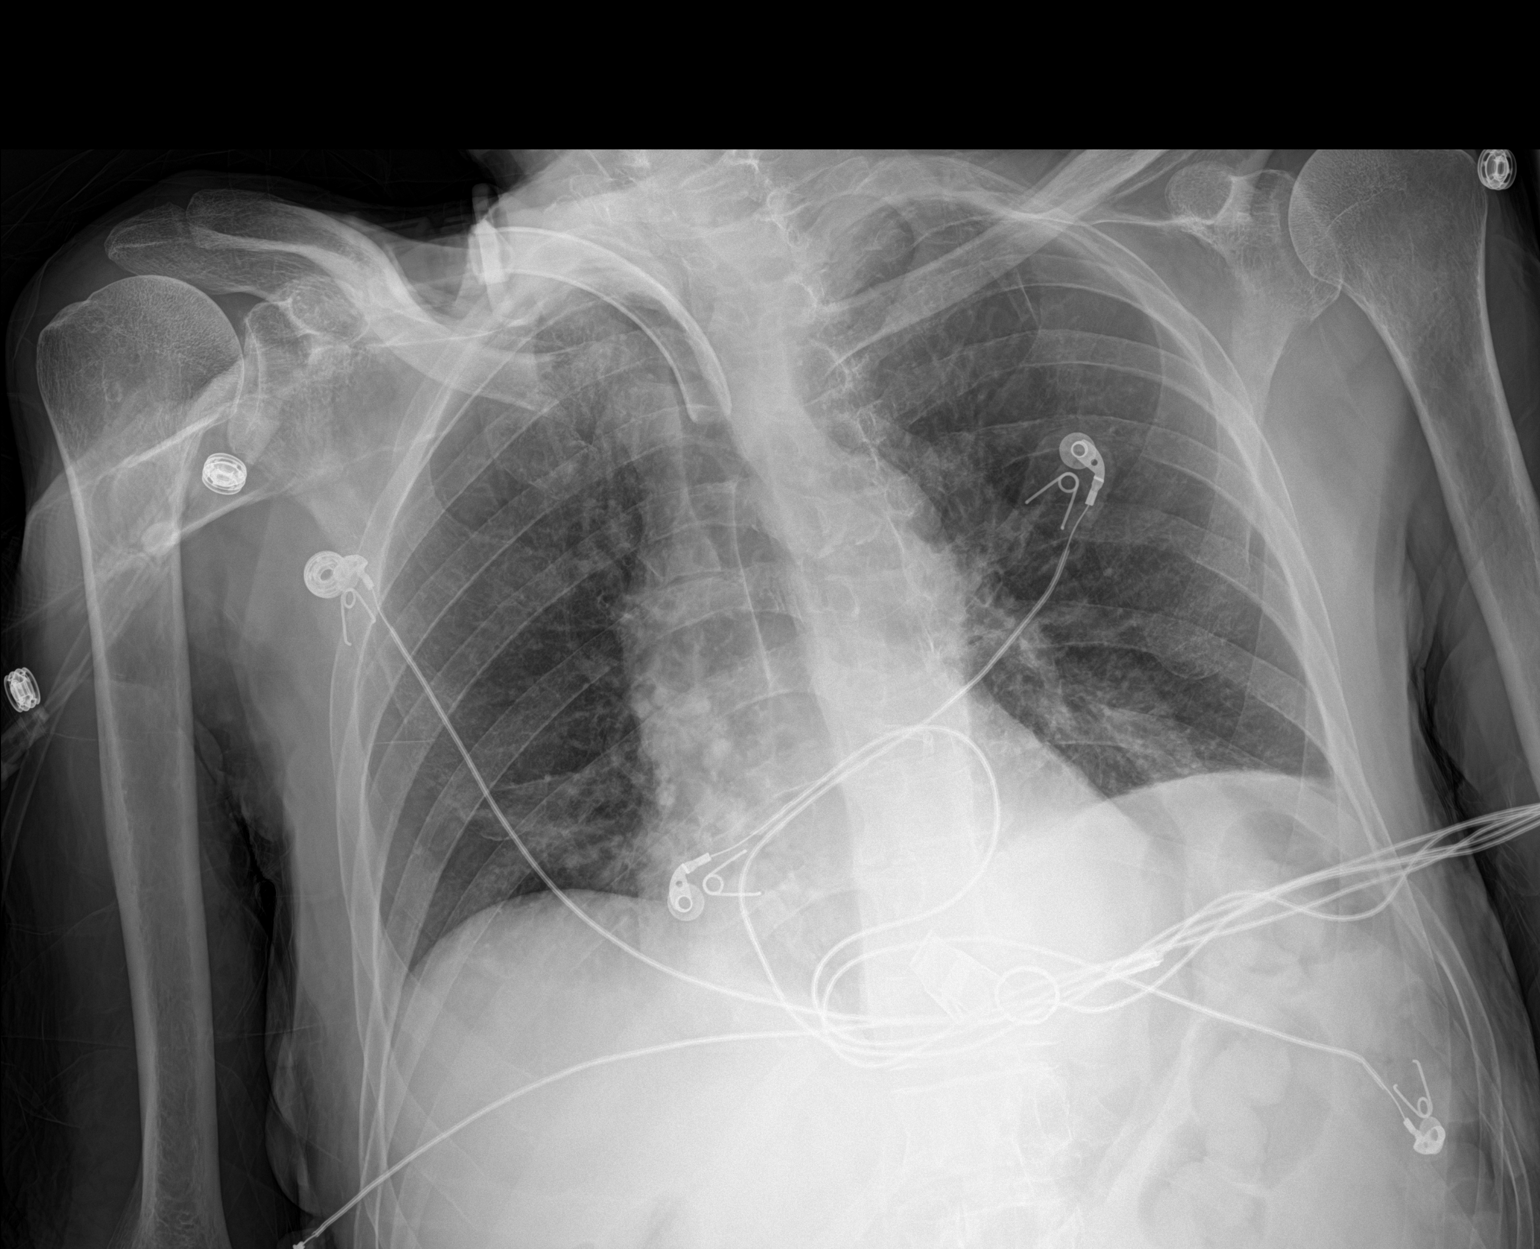

[1 of 1 positions shown; findings below may reference images not displayed]

FINDINGS: Cardiac shadow is stable. Tracheostomy tube is again noted and
stable. Lungs are well aerated bilaterally. Previously seen left
basilar atelectasis has resolved. No new focal abnormality is noted.
IMPRESSION: Resolution of previously seen atelectasis.
